# Patient Record
Sex: Female | Born: 1945
Health system: Southern US, Community
[De-identification: ages and names within clinical notes are randomized; demographics above are authoritative.]

## PROBLEM LIST (undated history)

## (undated) DIAGNOSIS — G629 Polyneuropathy, unspecified: Secondary | ICD-10-CM

## (undated) DIAGNOSIS — M1990 Unspecified osteoarthritis, unspecified site: Secondary | ICD-10-CM

## (undated) DIAGNOSIS — E559 Vitamin D deficiency, unspecified: Secondary | ICD-10-CM

## (undated) DIAGNOSIS — F32A Depression, unspecified: Secondary | ICD-10-CM

## (undated) DIAGNOSIS — G5793 Unspecified mononeuropathy of bilateral lower limbs: Secondary | ICD-10-CM

## (undated) DIAGNOSIS — R51 Headache: Secondary | ICD-10-CM

## (undated) DIAGNOSIS — K5792 Diverticulitis of intestine, part unspecified, without perforation or abscess without bleeding: Secondary | ICD-10-CM

## (undated) DIAGNOSIS — I10 Essential (primary) hypertension: Secondary | ICD-10-CM

## (undated) DIAGNOSIS — F329 Major depressive disorder, single episode, unspecified: Secondary | ICD-10-CM

## (undated) DIAGNOSIS — G5601 Carpal tunnel syndrome, right upper limb: Secondary | ICD-10-CM

## (undated) DIAGNOSIS — M5090 Cervical disc disorder, unspecified, unspecified cervical region: Secondary | ICD-10-CM

## (undated) DIAGNOSIS — K635 Polyp of colon: Secondary | ICD-10-CM

## (undated) DIAGNOSIS — N189 Chronic kidney disease, unspecified: Secondary | ICD-10-CM

## (undated) DIAGNOSIS — G43909 Migraine, unspecified, not intractable, without status migrainosus: Secondary | ICD-10-CM

## (undated) DIAGNOSIS — M19041 Primary osteoarthritis, right hand: Secondary | ICD-10-CM

## (undated) DIAGNOSIS — J45909 Unspecified asthma, uncomplicated: Secondary | ICD-10-CM

## (undated) DIAGNOSIS — E785 Hyperlipidemia, unspecified: Secondary | ICD-10-CM

## (undated) DIAGNOSIS — D649 Anemia, unspecified: Secondary | ICD-10-CM

## (undated) DIAGNOSIS — K219 Gastro-esophageal reflux disease without esophagitis: Secondary | ICD-10-CM

## (undated) DIAGNOSIS — R519 Headache, unspecified: Secondary | ICD-10-CM

## (undated) DIAGNOSIS — N184 Chronic kidney disease, stage 4 (severe): Secondary | ICD-10-CM

## (undated) HISTORY — DX: Diverticulitis of intestine, part unspecified, without perforation or abscess without bleeding: K57.92

## (undated) HISTORY — DX: Carpal tunnel syndrome, right upper limb: G56.01

## (undated) HISTORY — PX: CARPAL TUNNEL RELEASE: SHX101

## (undated) HISTORY — PX: WISDOM TOOTH EXTRACTION: SHX21

## (undated) HISTORY — PX: CERVICAL SPINE SURGERY: SHX589

## (undated) HISTORY — DX: Primary osteoarthritis, right hand: M19.041

## (undated) HISTORY — PX: COLONOSCOPY: SHX174

## (undated) HISTORY — DX: Polyp of colon: K63.5

## (undated) HISTORY — PX: EYE SURGERY: SHX253

## (undated) HISTORY — DX: Unspecified osteoarthritis, unspecified site: M19.90

## (undated) HISTORY — PX: CHOLECYSTECTOMY: SHX55

## (undated) HISTORY — DX: Hyperlipidemia, unspecified: E78.5

## (undated) HISTORY — DX: Migraine, unspecified, not intractable, without status migrainosus: G43.909

## (undated) HISTORY — PX: ABDOMINAL HYSTERECTOMY: SHX81

## (undated) HISTORY — DX: Chronic kidney disease, stage 4 (severe): N18.4

## (undated) HISTORY — DX: Vitamin D deficiency, unspecified: E55.9

## (undated) HISTORY — PX: NECK SURGERY: SHX720

## (undated) HISTORY — DX: Unspecified mononeuropathy of bilateral lower limbs: G57.93

---

## 2004-09-13 ENCOUNTER — Ambulatory Visit: Payer: Self-pay | Admitting: Internal Medicine

## 2005-01-30 ENCOUNTER — Ambulatory Visit: Payer: Self-pay | Admitting: Unknown Physician Specialty

## 2005-01-31 ENCOUNTER — Ambulatory Visit: Payer: Self-pay | Admitting: Gastroenterology

## 2005-10-17 ENCOUNTER — Ambulatory Visit: Payer: Self-pay | Admitting: Internal Medicine

## 2006-10-22 ENCOUNTER — Ambulatory Visit: Payer: Self-pay | Admitting: Internal Medicine

## 2007-10-23 ENCOUNTER — Ambulatory Visit: Payer: Self-pay | Admitting: Internal Medicine

## 2008-07-15 ENCOUNTER — Emergency Department: Payer: Self-pay | Admitting: Unknown Physician Specialty

## 2008-07-15 IMAGING — CT CT HEAD WITHOUT CONTRAST
2 series · 16 of 30 positions shown, 20 images · non-contrast
Comparison: none

REASON FOR EXAM: weakness dizziness
COMMENTS:

[Series 2: without · axial · non-contrast · 0.40mm/px · z∈[+264,+384]mm · 13 of 30 slices shown, 17 images]
[im 3/30  brain]
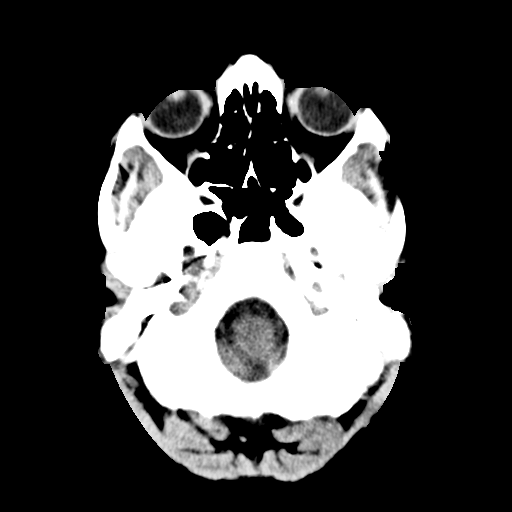
[im 3/30  bone]
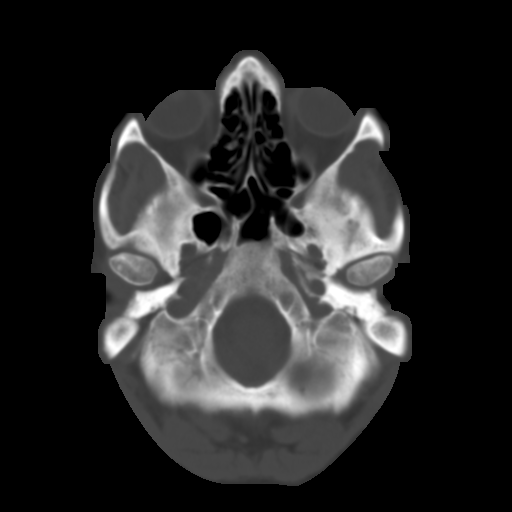
[im 5/30  brain]
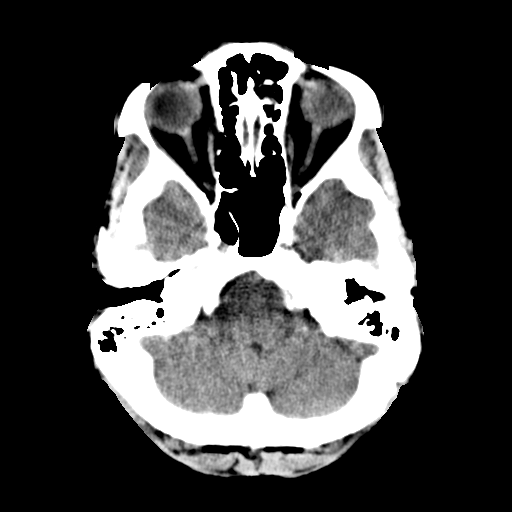
[im 7/30  brain]
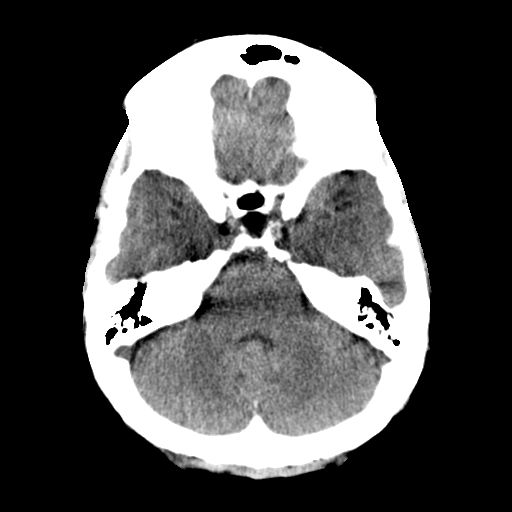
[im 9/30  brain]
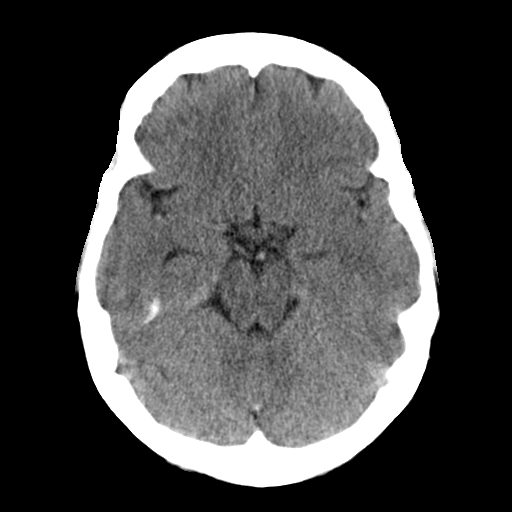
[im 11/30  brain]
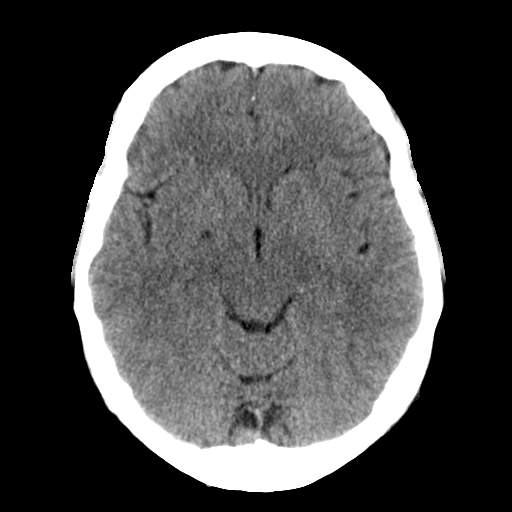
[im 11/30  bone]
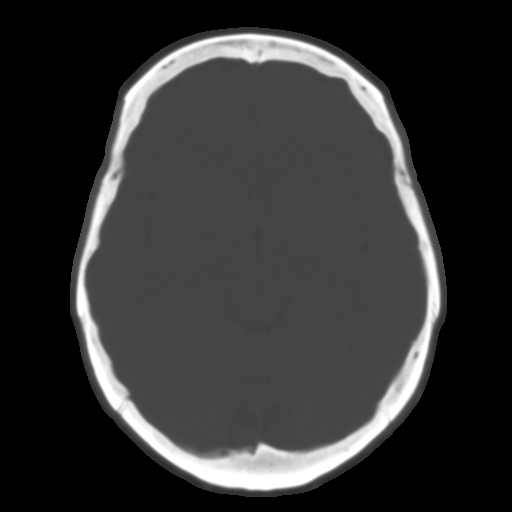
[im 13/30  brain]
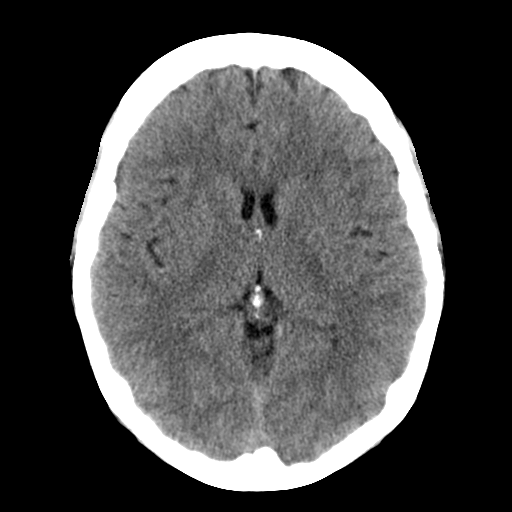
[im 15/30  brain]
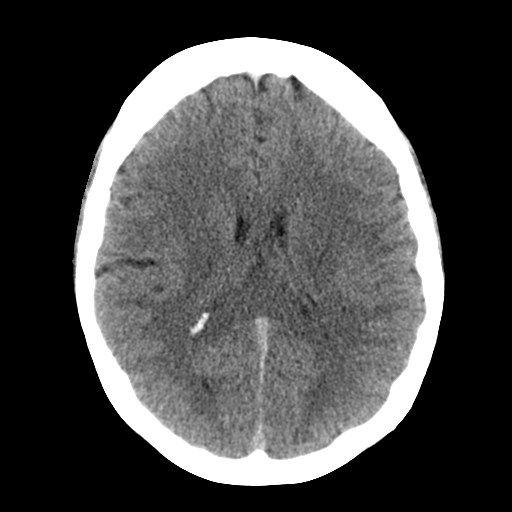
[im 17/30  brain]
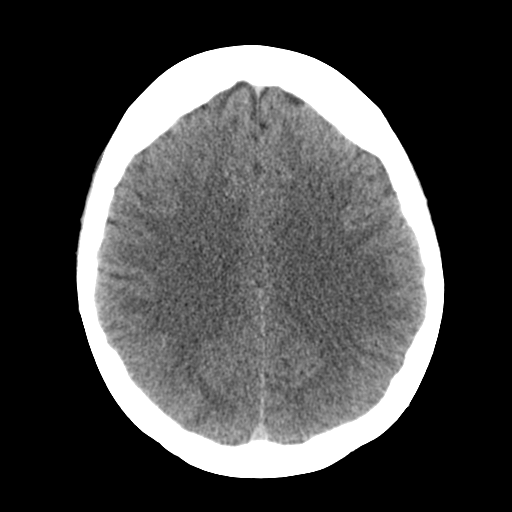
[im 19/30  brain]
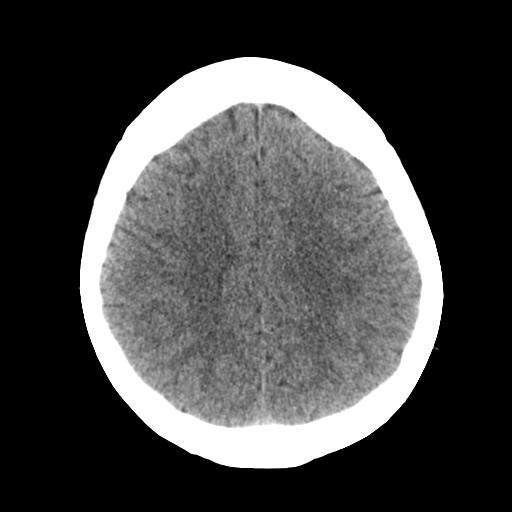
[im 19/30  bone]
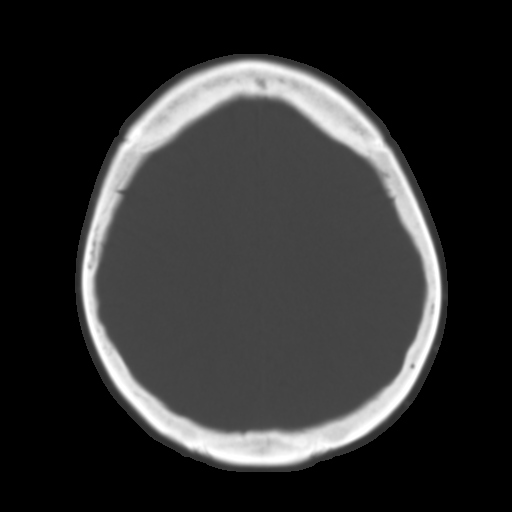
[im 21/30  brain]
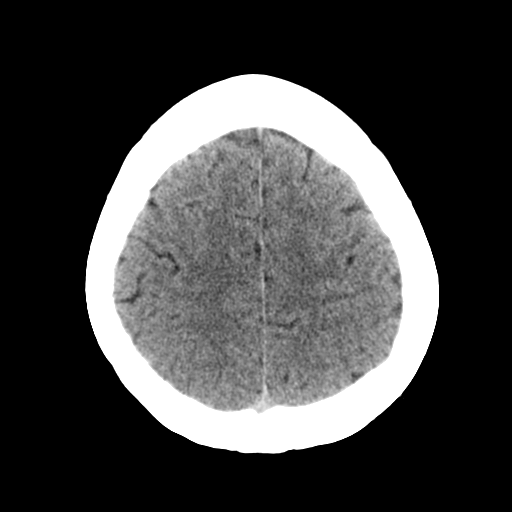
[im 23/30  brain]
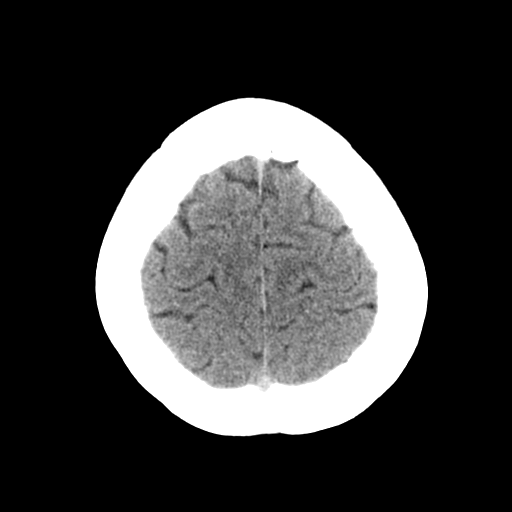
[im 25/30  brain]
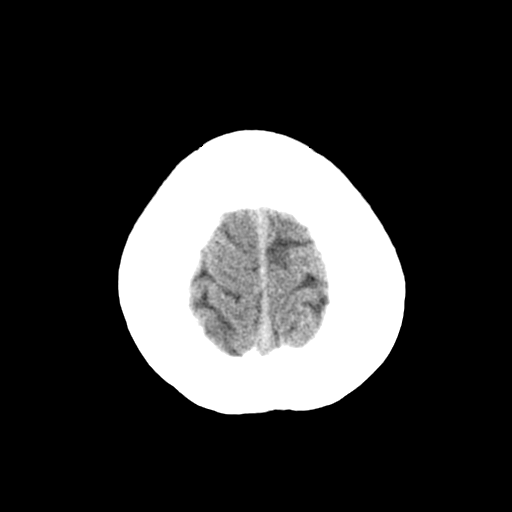
[im 27/30  brain]
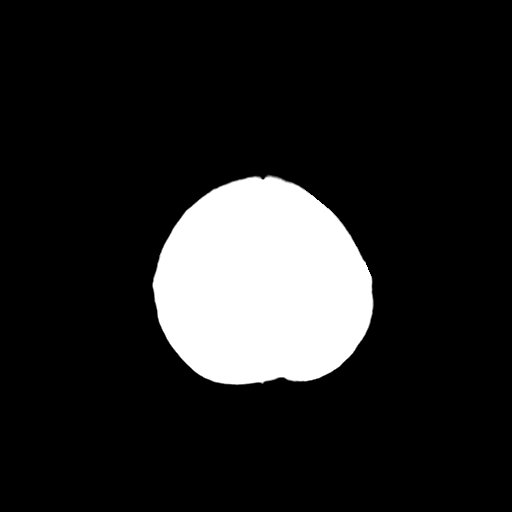
[im 27/30  bone]
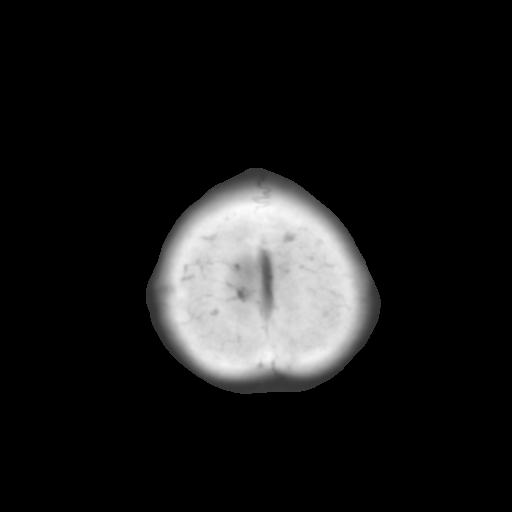

[Series 3: bone · axial · 0.40mm/px · z∈[+264,+304]mm · 3 of 30 slices shown]
[im 3/30  bone]
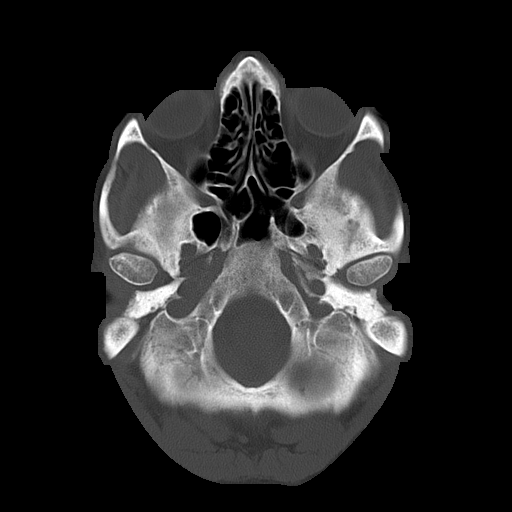
[im 7/30  bone]
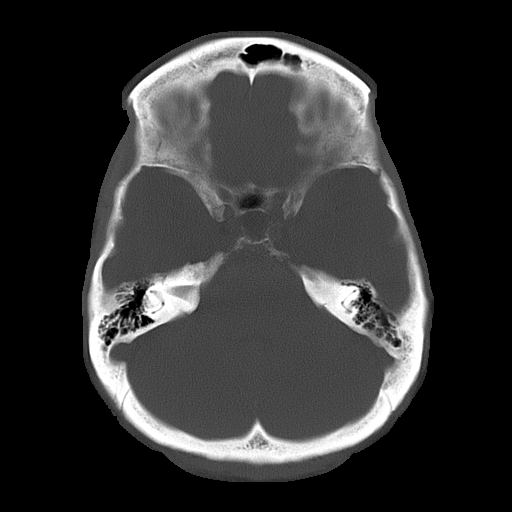
[im 11/30  bone]
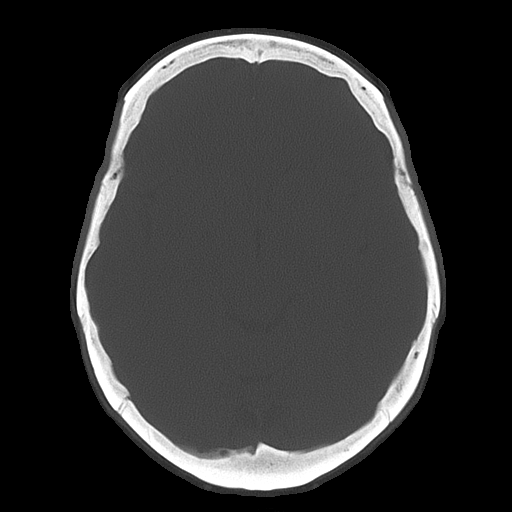

[16 of 30 positions shown; findings below may reference images not displayed]

PROCEDURE:     CT  - CT HEAD WITHOUT CONTRAST  - [DATE]  [DATE]

RESULT:     A focal area of well defined low attenuation projects along the
medial base of the RIGHT temporal lobe. Differential considerations are a
region of chronic infarction with subsequent gliosis versus a perivascular
space. No further intra-axial nor extra-axial fluid collections are
identified. There is no evidence of acute hemorrhage. No secondary signs are
appreciated to suggest mass effect. The visualized cranium demonstrates no
evidence of a depressed skull fracture.
IMPRESSION: 1. Chronic changes without evidence of acute abnormalities.

Dr. SUZETTE of the Emergency Department was informed of these findings at the
time of the initial interpretation.

## 2008-07-15 IMAGING — CR DG CHEST 2V
1 series · 2 of 2 positions shown · non-contrast
Comparison: none

REASON FOR EXAM: weakness
COMMENTS:

PROCEDURE:     DXR - DXR CHEST PA (OR AP) AND LATERAL  - [DATE]  [DATE]
RESULT:     The lungs are clear. The cardiac silhouette and visualized bony
skeleton are unremarkable.

[Series 1: view not recorded · 0.17mm/px · 2 of 2 slices shown]
[im 1/2]
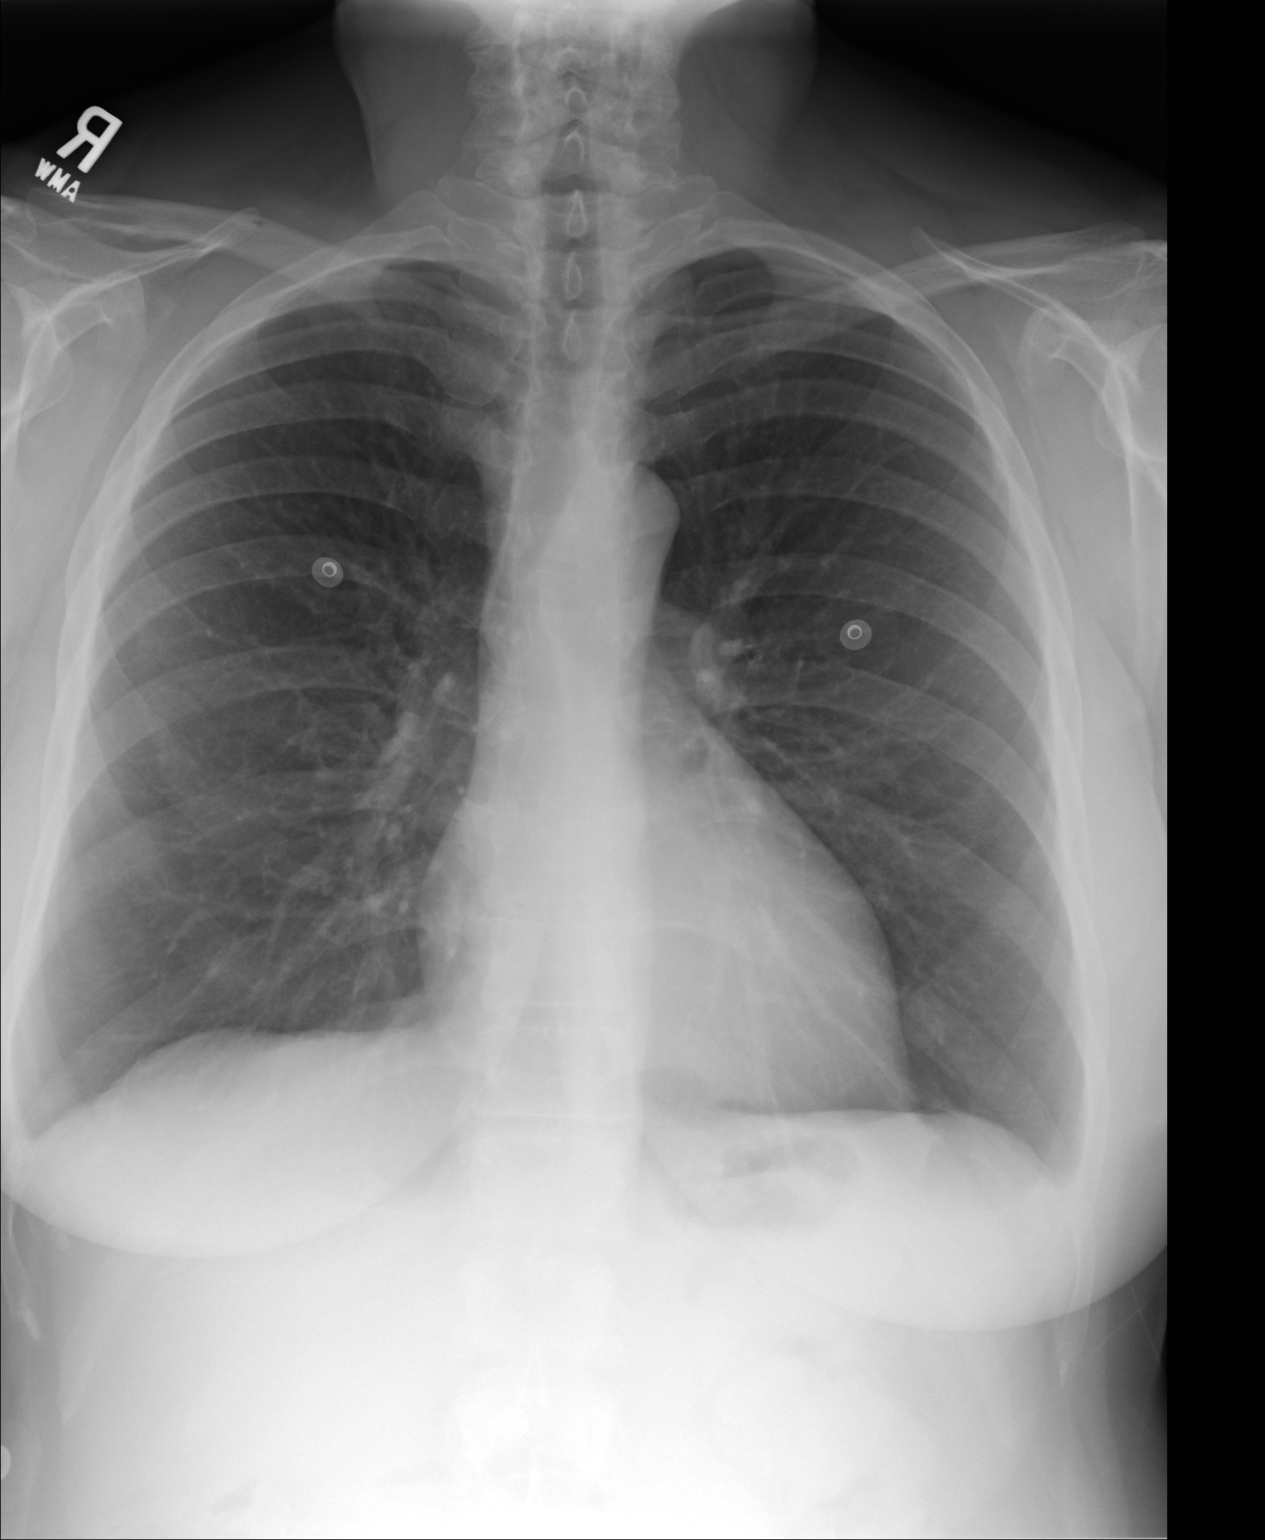
[im 2/2]
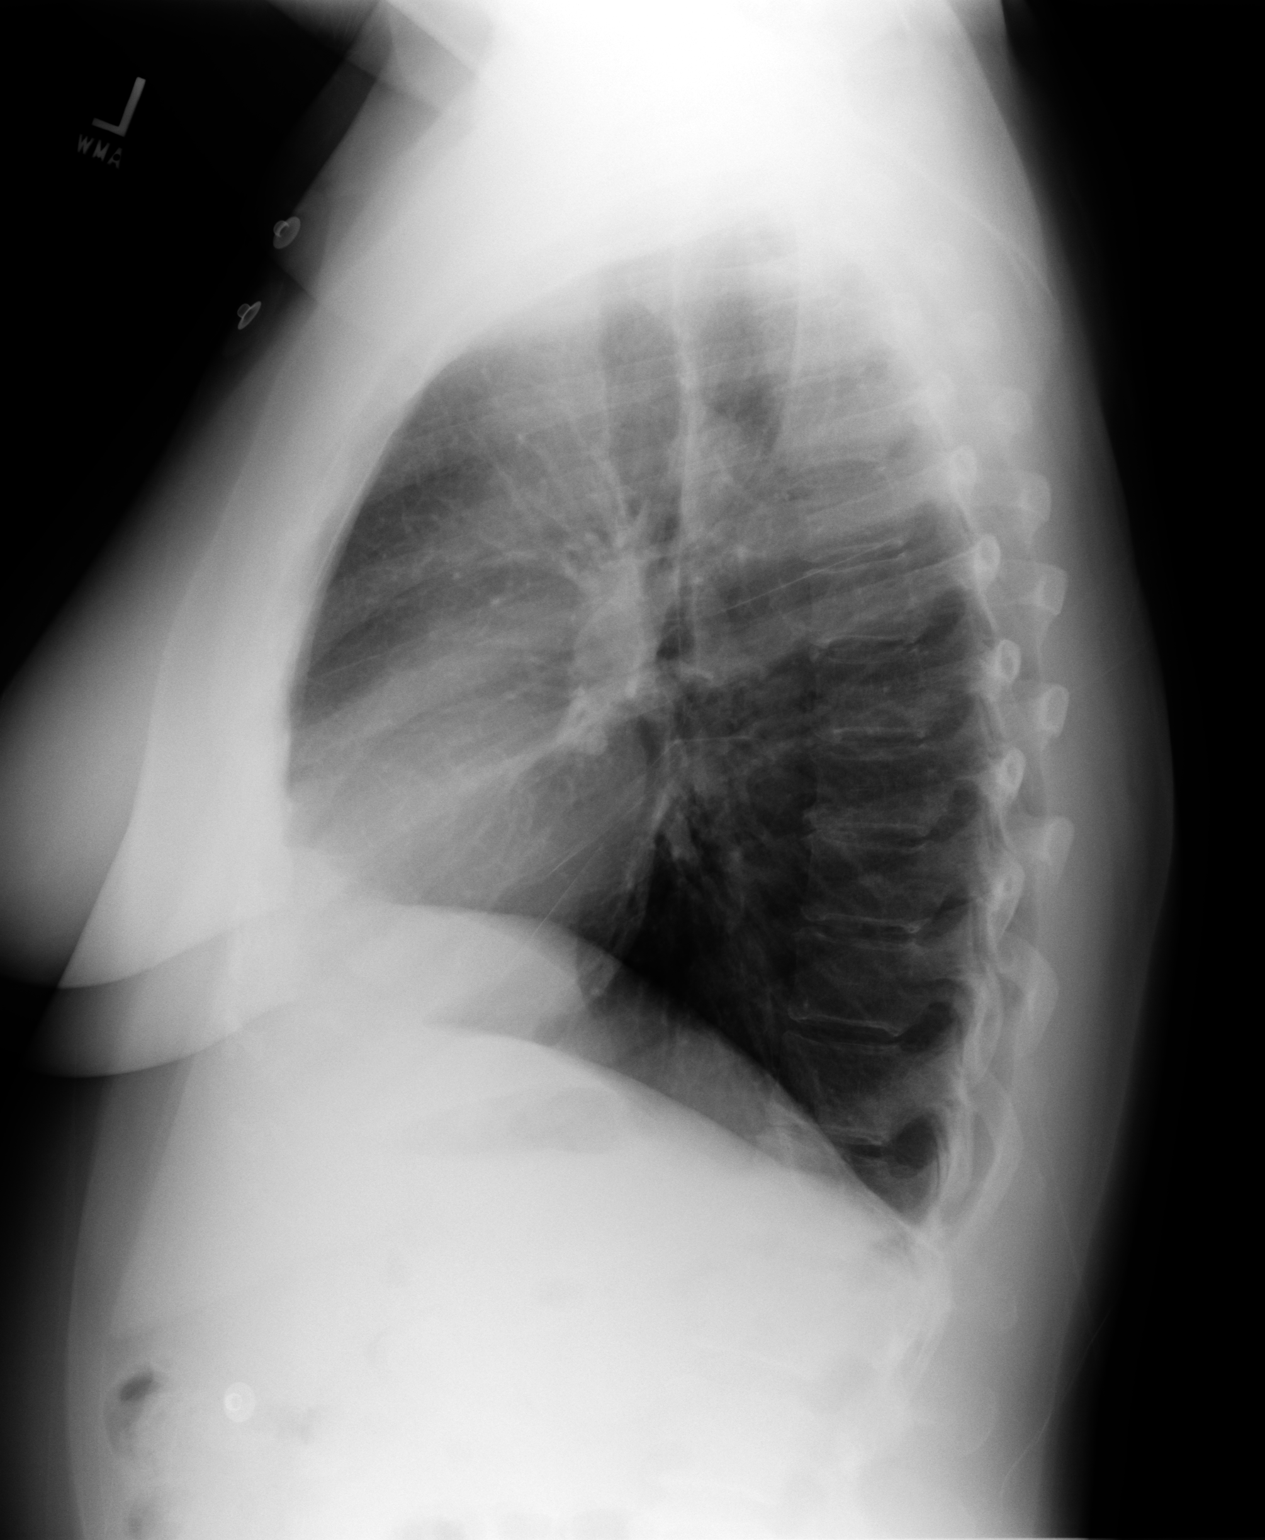

[2 of 2 positions shown; findings below may reference images not displayed]

IMPRESSION: 1. Chest radiograph without evidence of acute cardiopulmonary disease.

## 2008-08-04 ENCOUNTER — Ambulatory Visit: Payer: Self-pay | Admitting: Internal Medicine

## 2008-08-04 IMAGING — CT CT ANGIOGRAPHY NECK
1 of 4 series · 12 of 33 positions shown · IV contrast (APPLIED)
Comparison: none

REASON FOR EXAM: CVA per ER    dissected left ICA  CALL report  [[email or web address]]
Dr ANDARA
COMMENTS:

[Series 4: soft tissue · axial · 0.35mm/px · z∈[-819,-615]mm · 12 of 82 slices shown]
[im 7/82  soft-tissue]
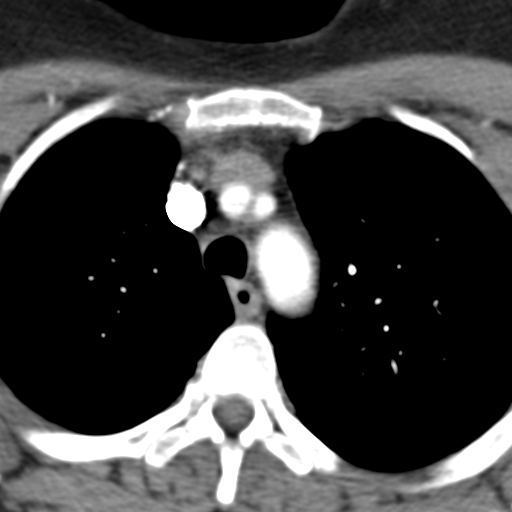
[im 13/82  bone]
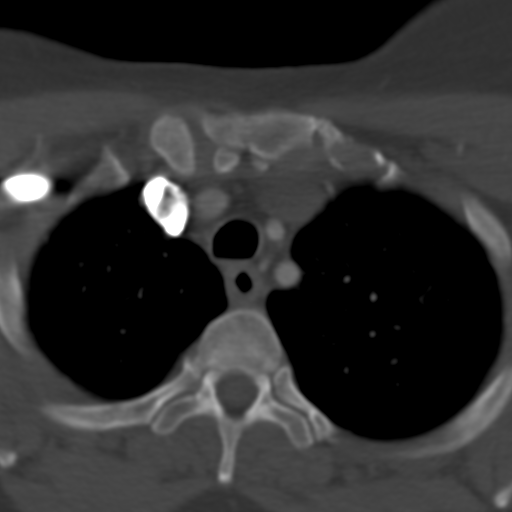
[im 19/82  soft-tissue]
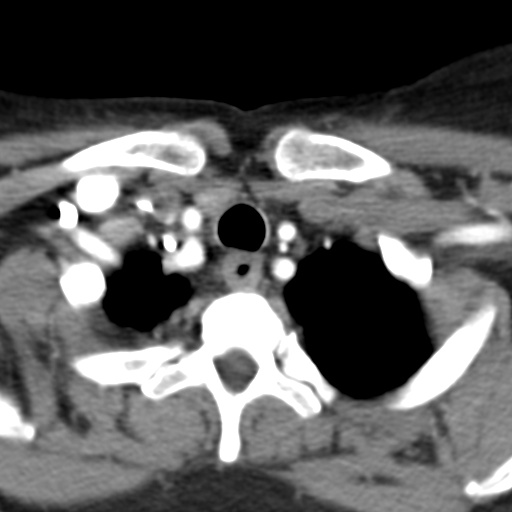
[im 25/82  bone]
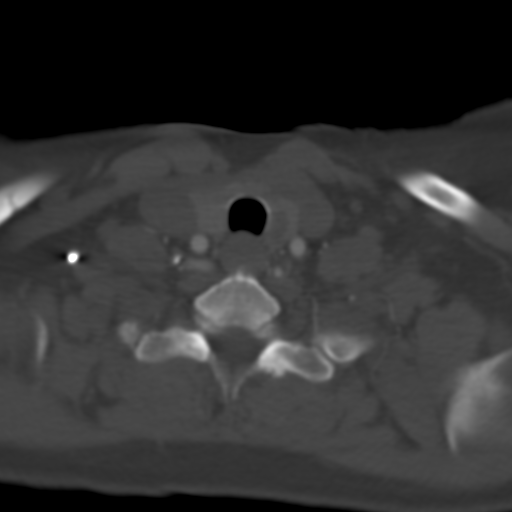
[im 32/82  soft-tissue]
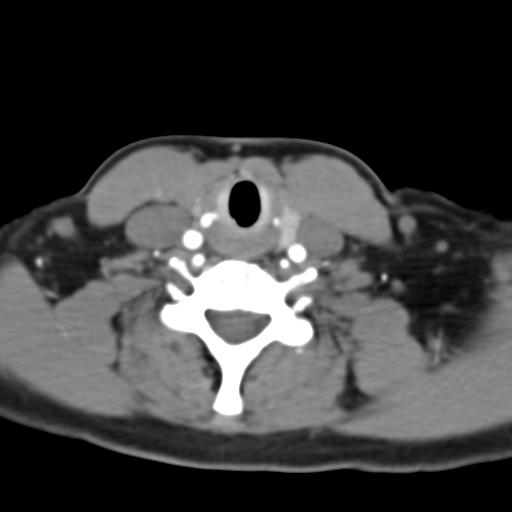
[im 38/82  bone]
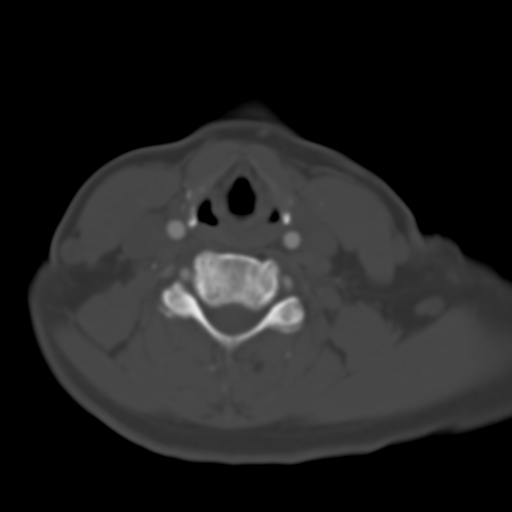
[im 44/82  soft-tissue]
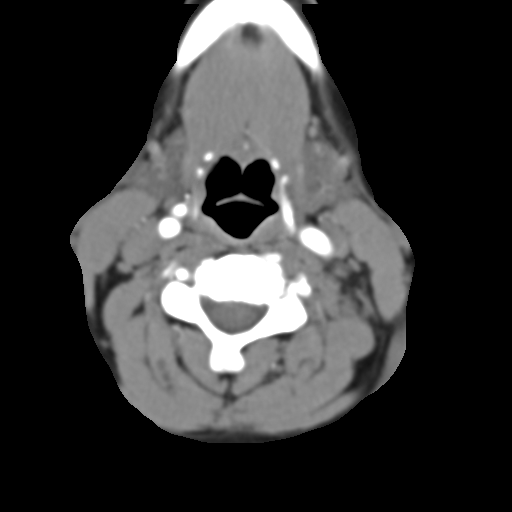
[im 50/82  bone]
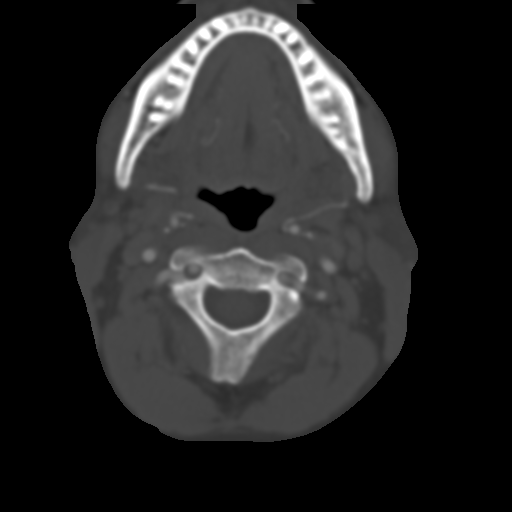
[im 57/82  soft-tissue]
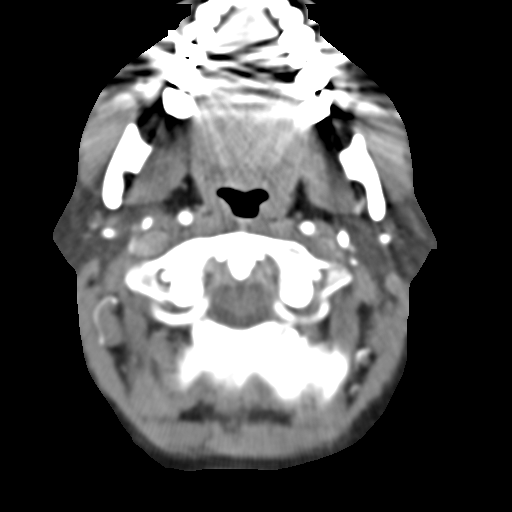
[im 63/82  bone]
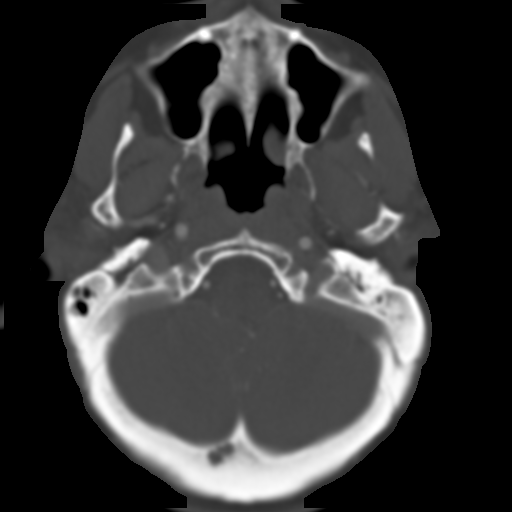
[im 69/82  soft-tissue]
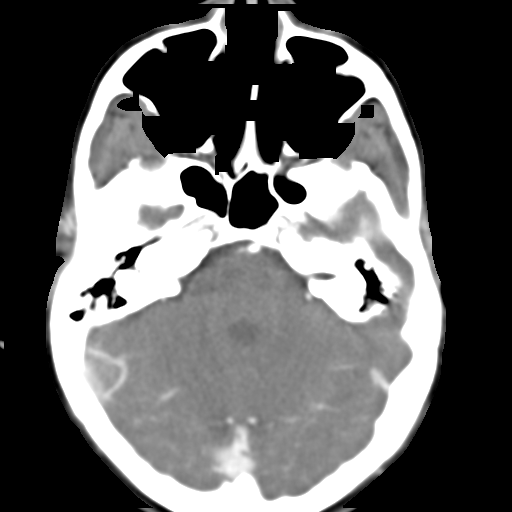
[im 75/82  bone]
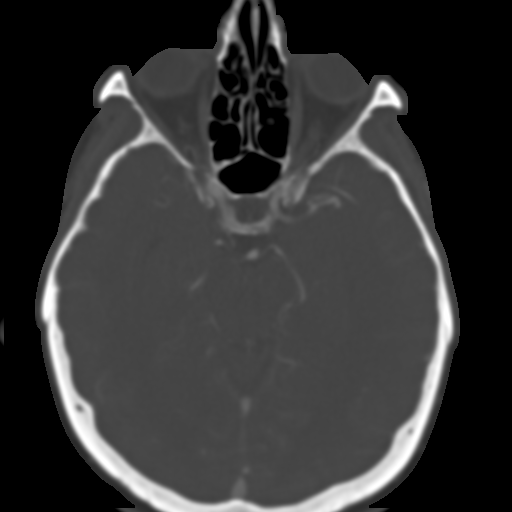

[12 of 33 positions shown; findings below may reference images not displayed]

PROCEDURE:     CT  - CT ANGIOGRAPHY NECK W/WO  - [DATE]  [DATE]

RESULT:     Axial source images 3-D reconstructions and 2-D maximum
intensity projections were performed of the RIGHT and LEFT carotid systems.

Evaluation of the RIGHT and LEFT carotid systems demonstrates no evidence of
focal outpouchings, fusiform dilatation, strictural narrowing, nor intimal
flap consistent with dissection. There is no evidence of mural
calcifications nor plaques.
IMPRESSION: 1. Unremarkable bilateral carotid angiogram as described above.

## 2008-10-26 ENCOUNTER — Ambulatory Visit: Payer: Self-pay | Admitting: Internal Medicine

## 2009-11-15 ENCOUNTER — Ambulatory Visit: Payer: Self-pay | Admitting: Internal Medicine

## 2011-01-21 ENCOUNTER — Ambulatory Visit: Payer: Self-pay | Admitting: Gastroenterology

## 2011-05-08 ENCOUNTER — Ambulatory Visit: Payer: Self-pay | Admitting: Internal Medicine

## 2012-05-12 ENCOUNTER — Ambulatory Visit: Payer: Self-pay | Admitting: Internal Medicine

## 2012-05-26 ENCOUNTER — Ambulatory Visit: Payer: Self-pay | Admitting: Internal Medicine

## 2012-05-26 IMAGING — US ABDOMEN ULTRASOUND
1 series · 14 of 25 positions shown · non-contrast
Comparison: none

REASON FOR EXAM: epigastric pain
COMMENTS:

PROCEDURE:     US  - US ABDOMEN GENERAL SURVEY  - [DATE]  [DATE]
RESULT:     History: Epigastric pain.
Comparison Study: No recent.

[Series 1: abdomen ultrasound · 0.28mm/px · 14 of 115 slices shown]
[im 1/115]
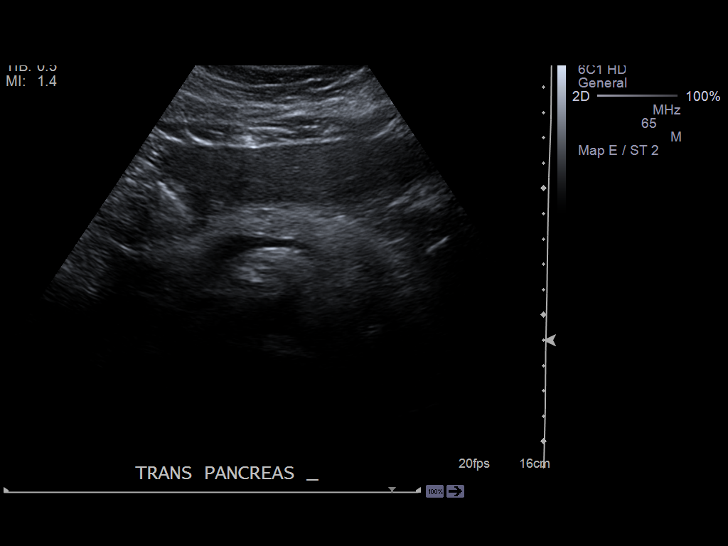
[im 10/115]
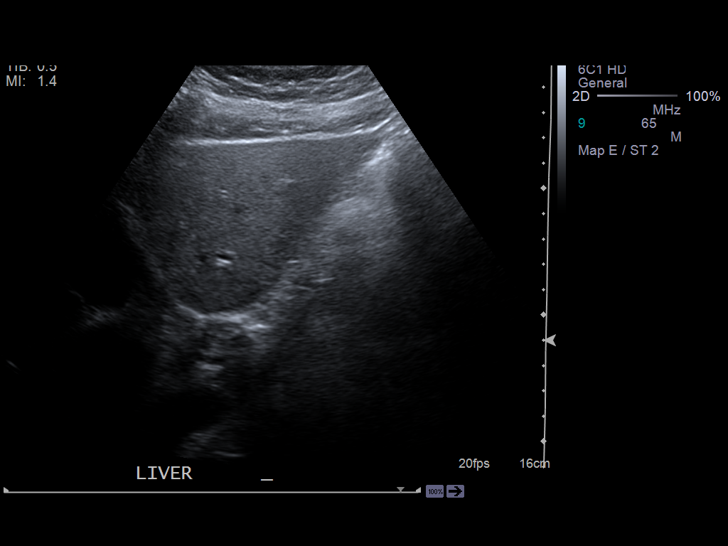
[im 20/115]
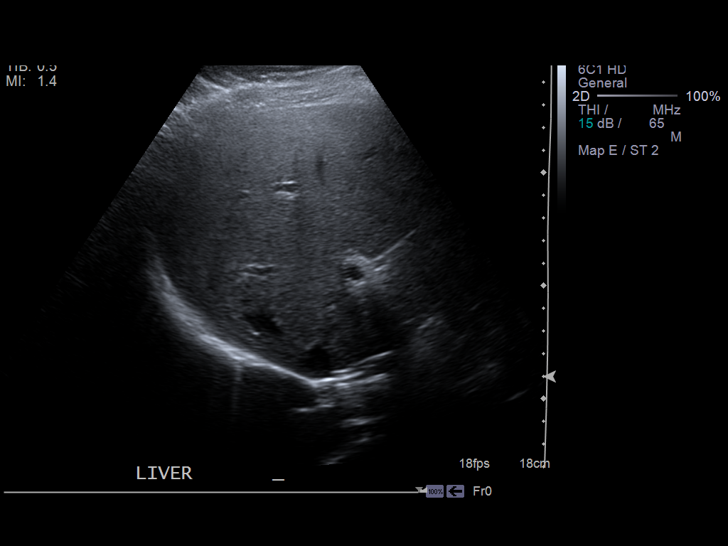
[im 29/115]
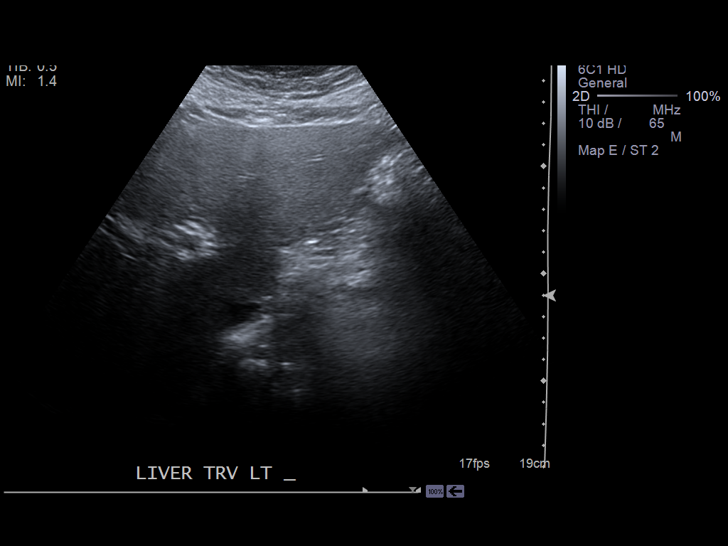
[im 39/115]
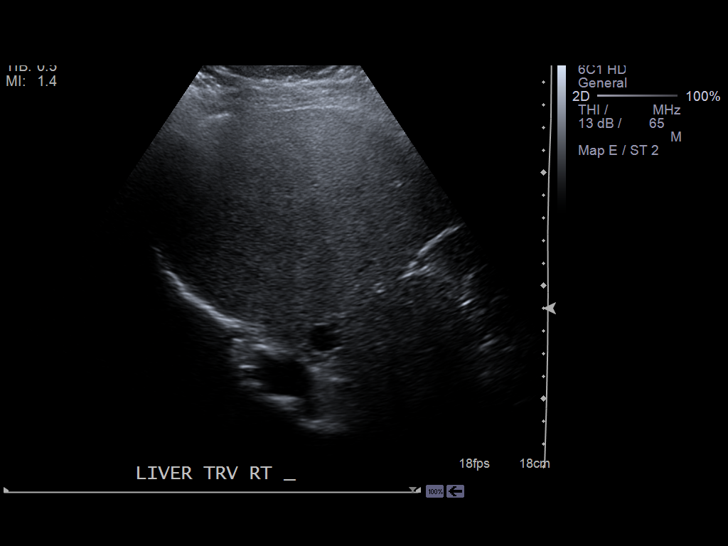
[im 43/115]
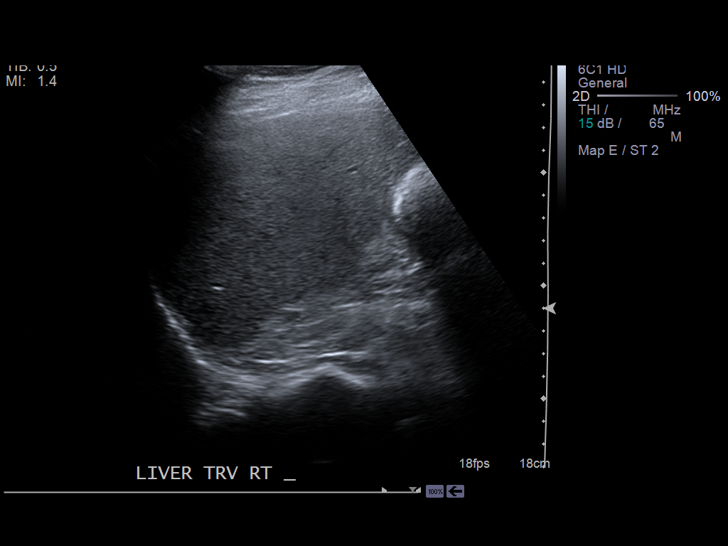
[im 53/115]
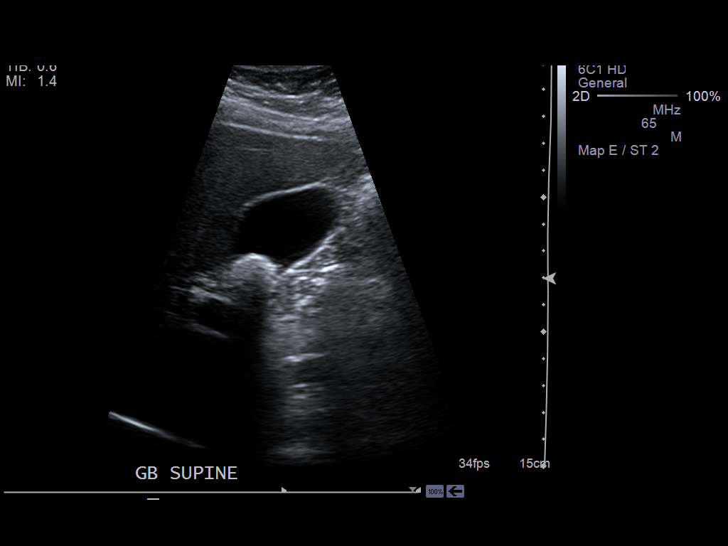
[im 62/115]
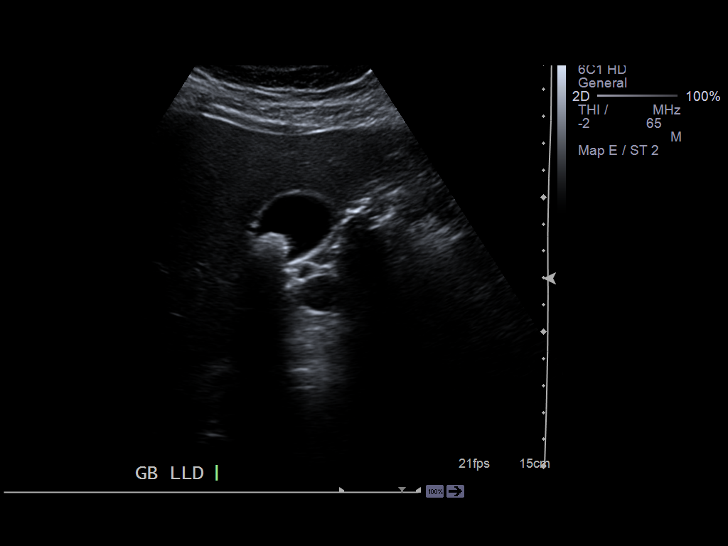
[im 72/115]
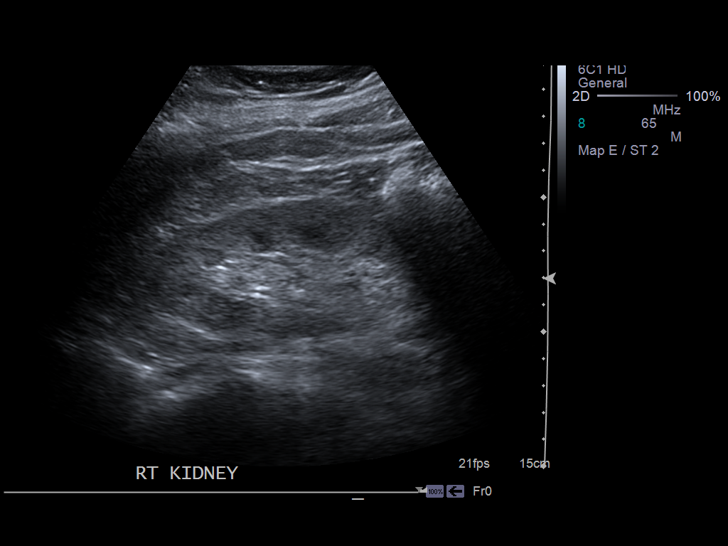
[im 77/115]
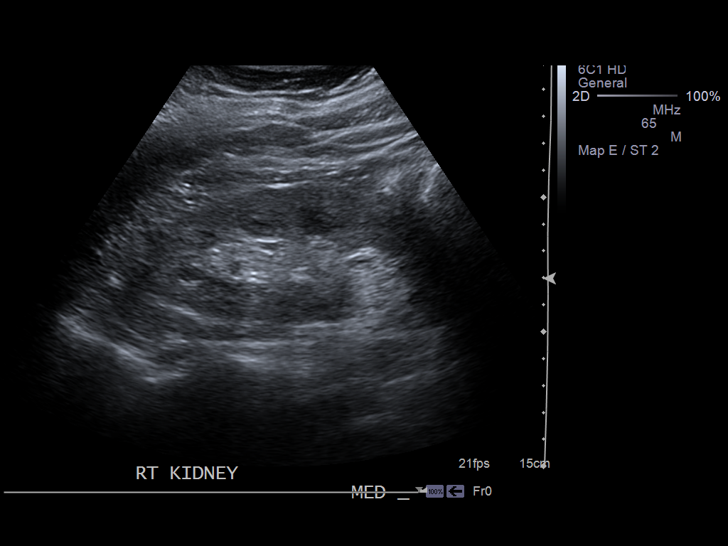
[im 86/115]
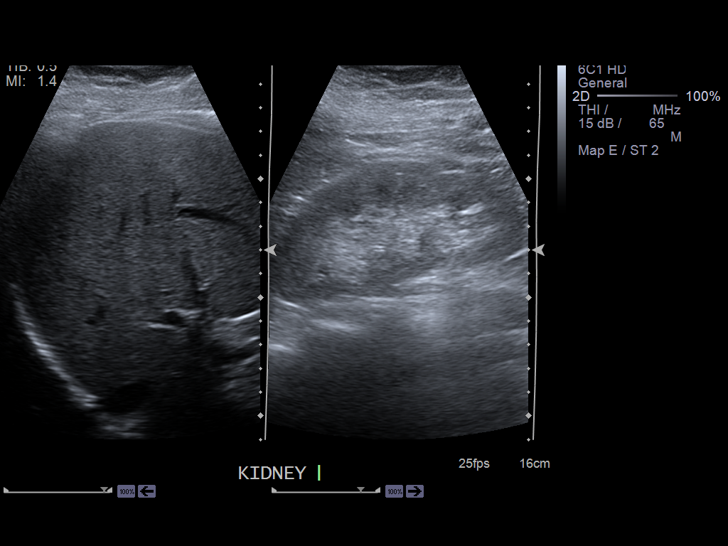
[im 96/115]
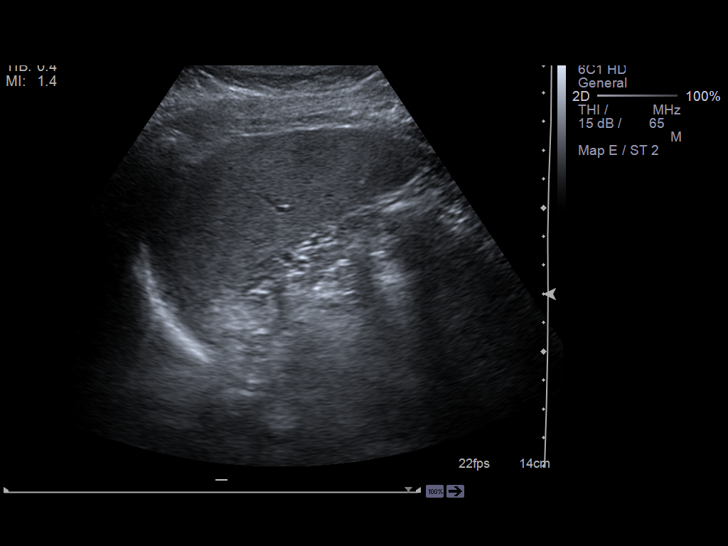
[im 105/115]
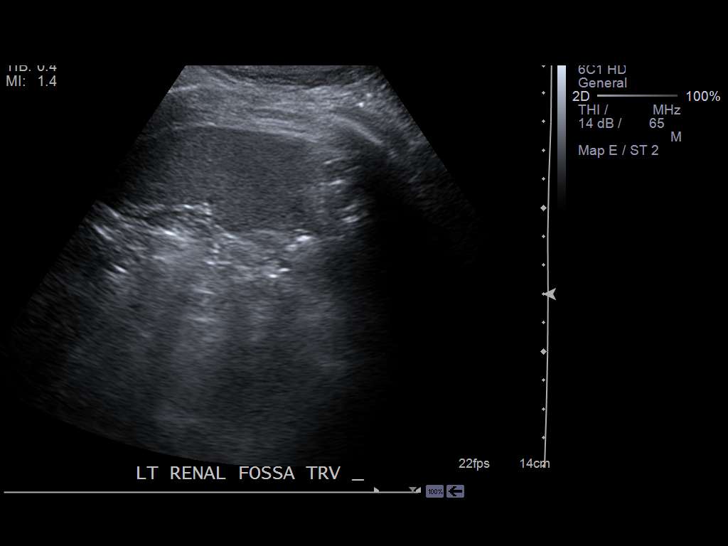
[im 115/115]
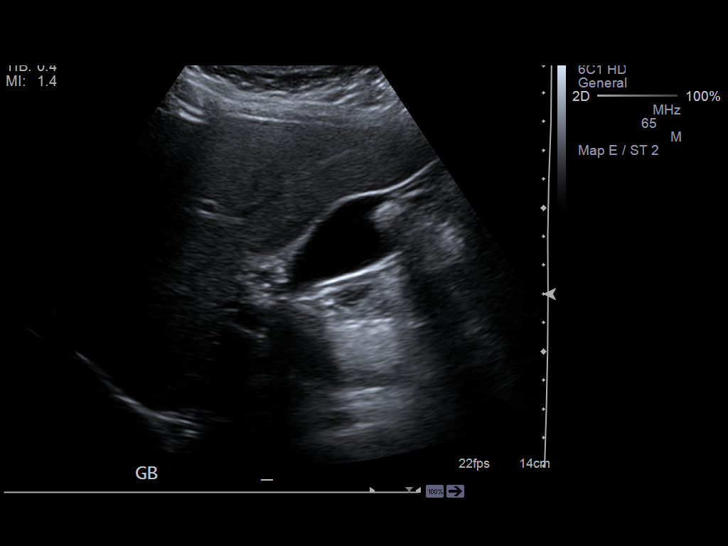

[14 of 25 positions shown; findings below may reference images not displayed]

FINDINGS: Gallstones are present. Negative Murphy's sign. Gallbladder wall
[TE]. 8mm,common bile duct diameter 5 mm. Spleen normal. Normal right
kidney. Left kidney not visualized. 2 cm septated cystright lobe of the
liver. CT of the abdomen can be obtained for further evaluation.
IMPRESSION: 2 cm complex cyst right lobe of liver  for which CT can be
obtained for further evaluation. Gallstones. Left kidney not visualized. By
history patient states that she has no left kidney.

## 2012-06-09 ENCOUNTER — Ambulatory Visit: Payer: Self-pay | Admitting: Anesthesiology

## 2012-06-09 LAB — CBC WITH DIFFERENTIAL/PLATELET
Basophil #: 0 10*3/uL (ref 0.0–0.1)
Basophil %: 0.9 %
HCT: 39.2 % (ref 35.0–47.0)
HGB: 13.4 g/dL (ref 12.0–16.0)
Lymphocyte #: 1.4 10*3/uL (ref 1.0–3.6)
MCV: 83 fL (ref 80–100)
Neutrophil %: 57.7 %
RBC: 4.72 10*6/uL (ref 3.80–5.20)
RDW: 13.8 % (ref 11.5–14.5)

## 2012-06-09 LAB — BASIC METABOLIC PANEL
BUN: 34 mg/dL — ABNORMAL HIGH (ref 7–18)
Calcium, Total: 9 mg/dL (ref 8.5–10.1)
Chloride: 111 mmol/L — ABNORMAL HIGH (ref 98–107)
Co2: 22 mmol/L (ref 21–32)
Creatinine: 1.76 mg/dL — ABNORMAL HIGH (ref 0.60–1.30)
EGFR (Non-African Amer.): 30 — ABNORMAL LOW
Osmolality: 289 (ref 275–301)
Sodium: 141 mmol/L (ref 136–145)

## 2012-06-09 LAB — HEPATIC FUNCTION PANEL A (ARMC)
Albumin: 3.6 g/dL (ref 3.4–5.0)
Bilirubin, Direct: <0.1 mg/dL (ref 0.00–0.20)
Bilirubin,Total: 0.3 mg/dL (ref 0.2–1.0)
SGOT(AST): 22 U/L (ref 15–37)
SGPT (ALT): 25 U/L (ref 12–78)
Total Protein: 7.3 g/dL (ref 6.4–8.2)

## 2012-06-15 ENCOUNTER — Ambulatory Visit: Payer: Self-pay | Admitting: Surgery

## 2012-06-16 LAB — PATHOLOGY REPORT

## 2012-11-15 ENCOUNTER — Emergency Department: Payer: Self-pay | Admitting: Emergency Medicine

## 2012-11-15 IMAGING — CR DG FOOT COMPLETE 3+V*L*
1 series · 3 of 3 positions shown · non-contrast
Comparison: none

REASON FOR EXAM: pain, injury
COMMENTS:

PROCEDURE:     DXR - DXR FOOT LT COMP W/OBLIQUES  - [DATE]  [DATE]
RESULT:     Left foot images demonstrate a transverse fracture at the base
of the fifth metatarsal without distraction or comminution. Bony structures
are otherwise intact.

[Series 1: ap · 0.17mm/px · 3 of 3 slices shown]
[im 1/3]
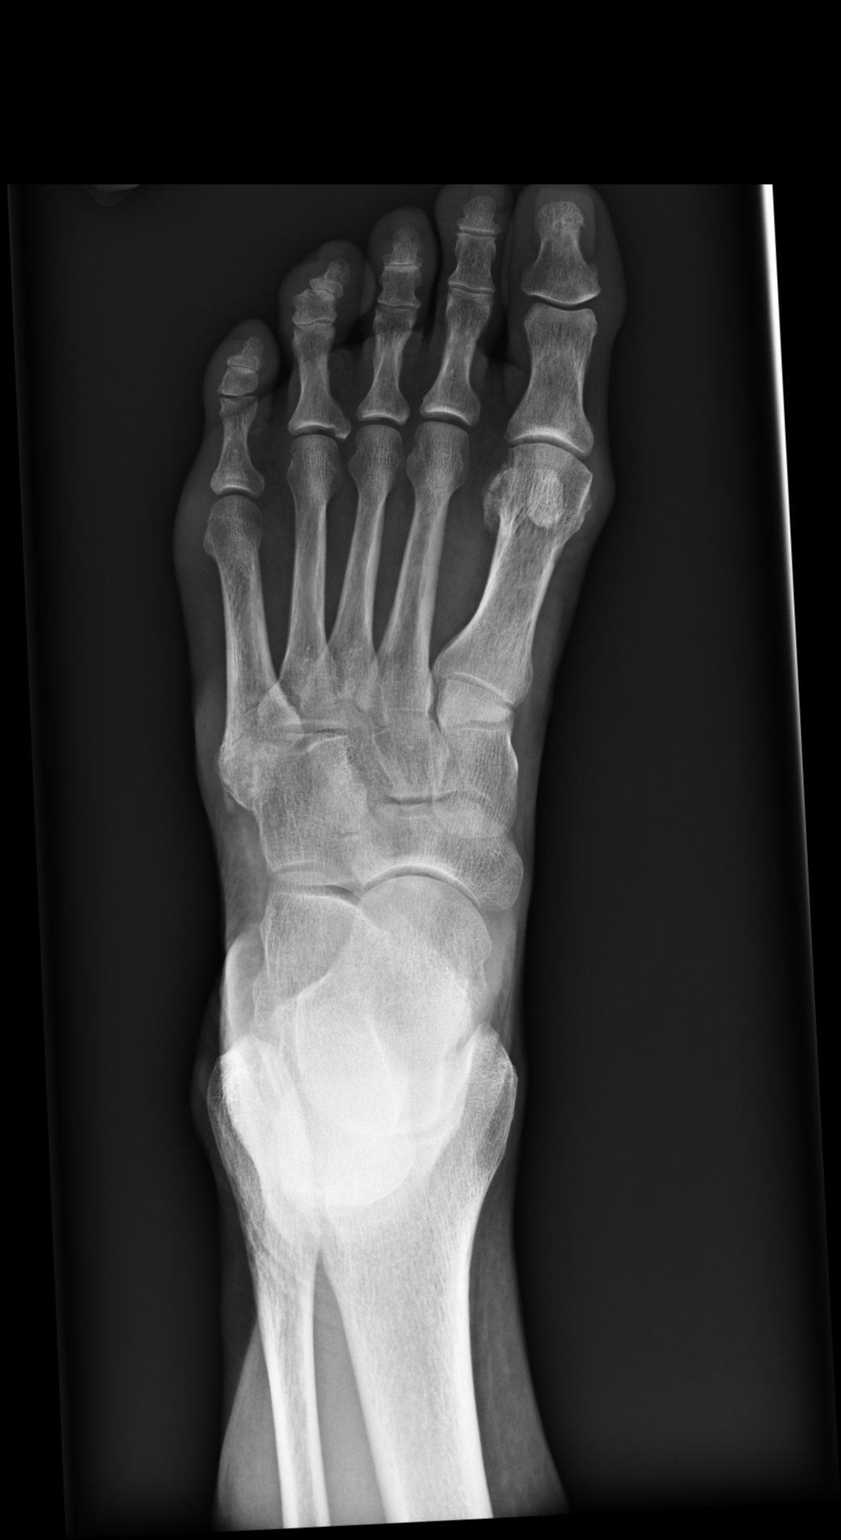
[im 2/3]
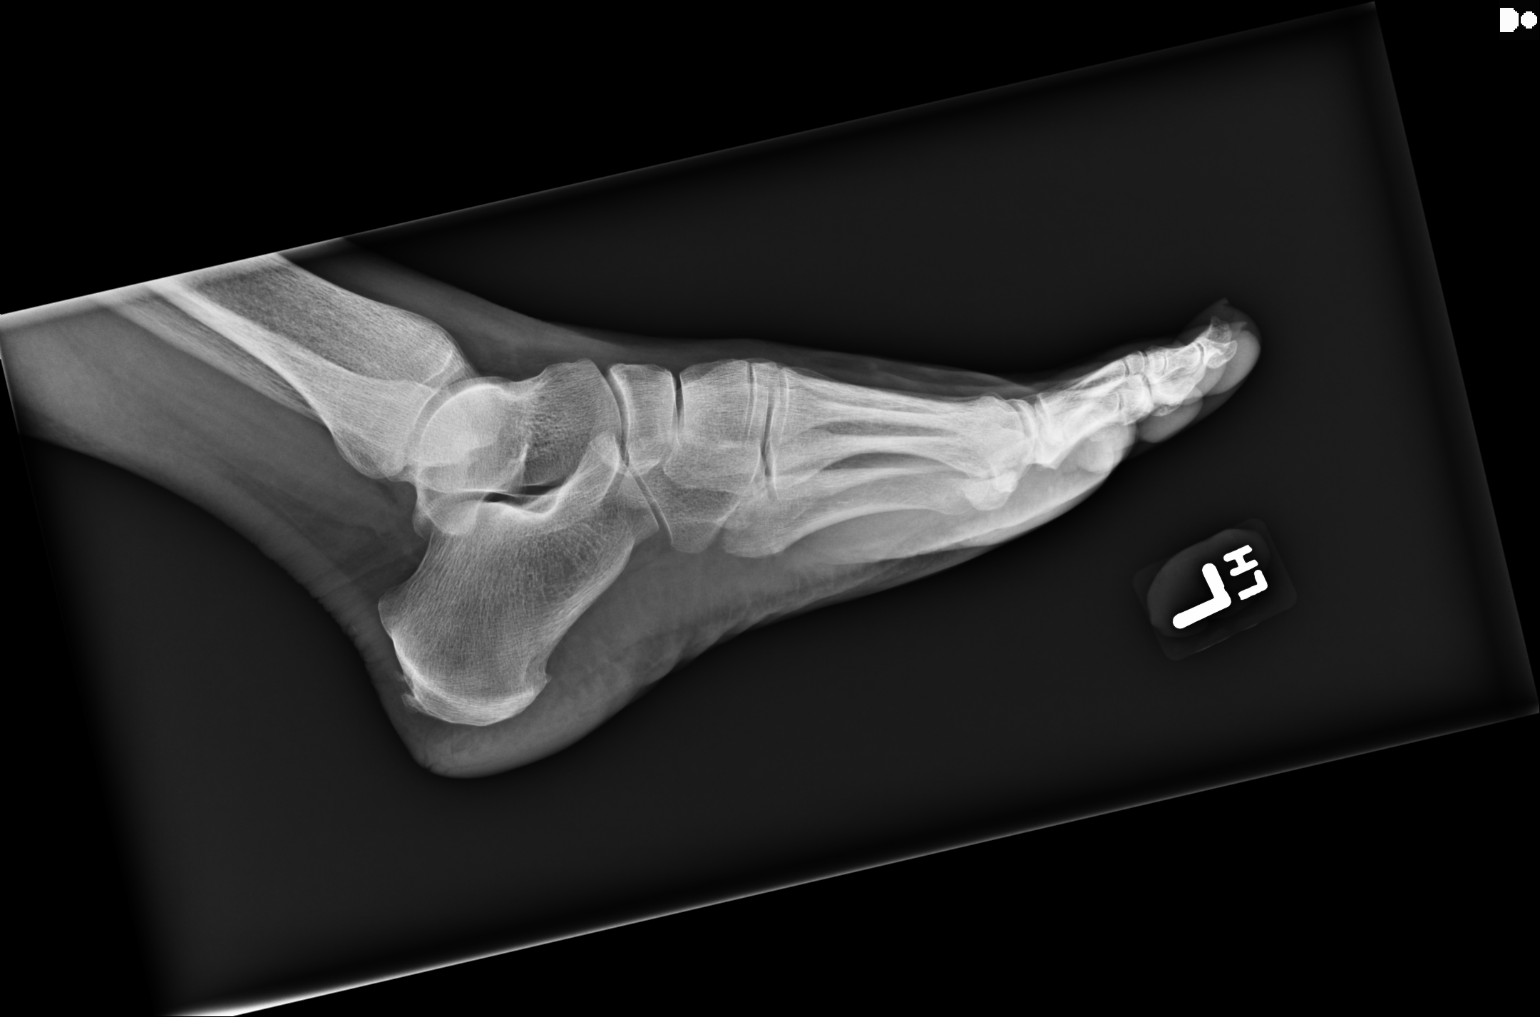
[im 3/3]
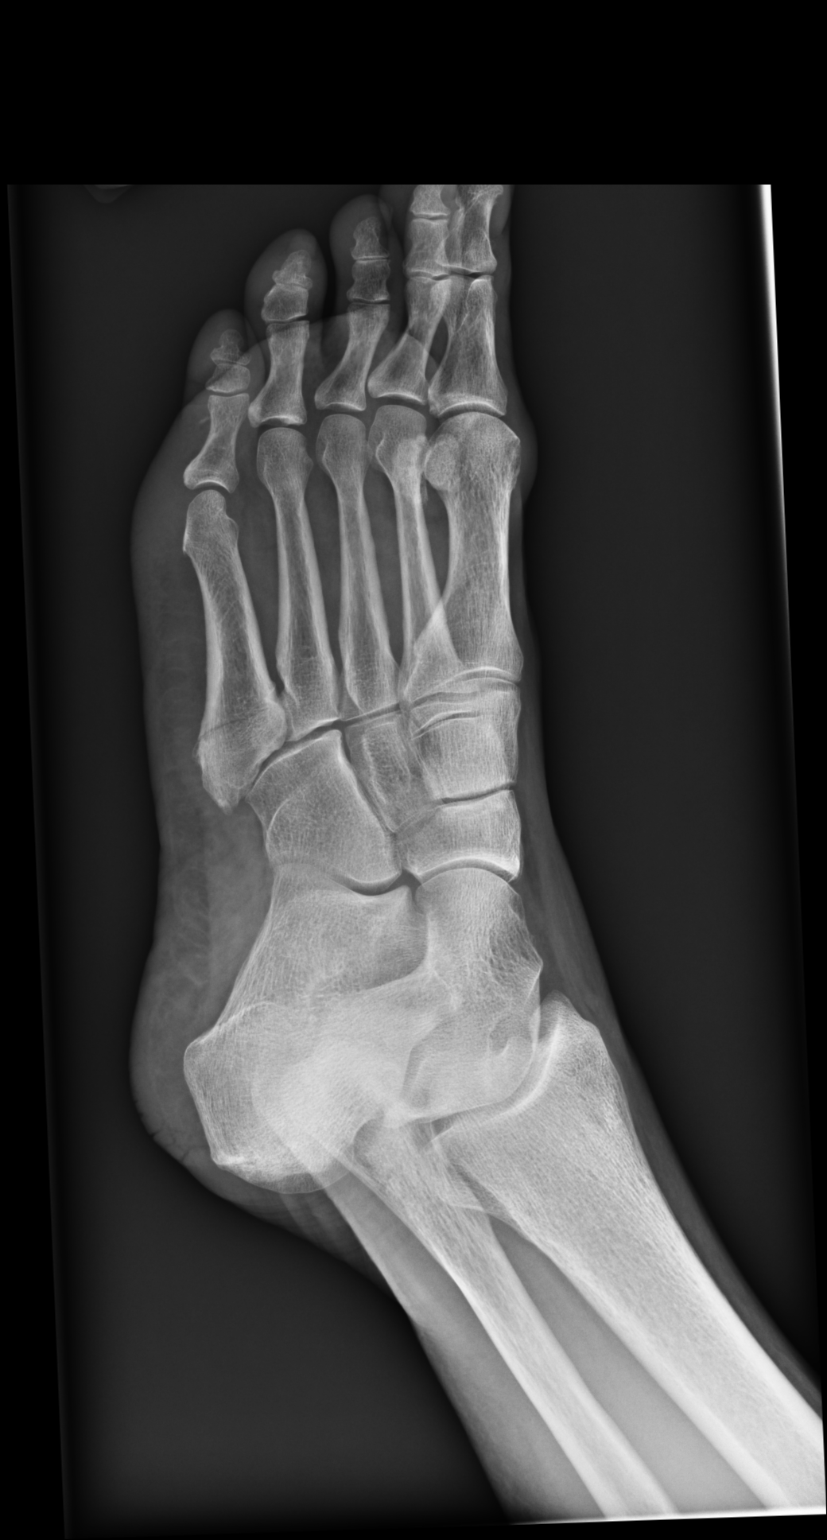

[3 of 3 positions shown; findings below may reference images not displayed]

IMPRESSION: A transverse fracture without comminution or distraction at
the base of the left fifth metatarsal.

[REDACTED]

## 2013-06-09 ENCOUNTER — Ambulatory Visit: Payer: Self-pay | Admitting: Internal Medicine

## 2013-10-30 DIAGNOSIS — K219 Gastro-esophageal reflux disease without esophagitis: Secondary | ICD-10-CM | POA: Insufficient documentation

## 2013-10-30 DIAGNOSIS — N184 Chronic kidney disease, stage 4 (severe): Secondary | ICD-10-CM | POA: Insufficient documentation

## 2013-10-30 DIAGNOSIS — I10 Essential (primary) hypertension: Secondary | ICD-10-CM | POA: Insufficient documentation

## 2013-10-30 DIAGNOSIS — E785 Hyperlipidemia, unspecified: Secondary | ICD-10-CM | POA: Insufficient documentation

## 2014-05-12 DIAGNOSIS — F325 Major depressive disorder, single episode, in full remission: Secondary | ICD-10-CM | POA: Insufficient documentation

## 2014-08-26 NOTE — Op Note (Signed)
PATIENT NAME:  Amy Vazquez, Amy Vazquez MR#:  [id number] DATE OF BIRTH:  May 26, 1945  DATE OF PROCEDURE:  06/15/2012  PREOPERATIVE DIAGNOSIS: Chronic cholecystitis and cholelithiasis.   POSTOPERATIVE DIAGNOSIS: Chronic cholecystitis and cholelithiasis   OPERATION: Robotic-assisted cholecystectomy.   SURGEON: Rodena Goldmann, III, MD    ASSISTANT: Glena Norfolk. Lundquist, MD   ANESTHESIA: General.   OPERATIVE PROCEDURE: With the patient in the supine position after induction of appropriate general anesthesia, the patient's abdomen was prepped with ChloraPrep and draped with sterile towels. An infraumbilical incision was made in the standard fashion, carried down bluntly through the subcutaneous tissue. The Veress needle was used to cannulate the peritoneal cavity. CO2 was insufflated to appropriate pressure measurements. When approximately 2.5 liters of CO2 were instilled, the Veress needle was withdrawn, and a 12 mm Xcel port inserted into the peritoneal cavity. Intraperitoneal position was confirmed. CO2 was reinsufflated. The robotic camera was used to identify the entrance site to the remainder of the ports. The 8 mm ports were inserted, 1 in the left upper quadrant and 2 on the right side. The robot was then docked to the camera and the instrument arms. Instruments were placed under direct vision and directed up to the area of the gallbladder. The instruments were all locked in place, and then the surgeon moved to the robotic console. The gallbladder was grasped, retracted superiorly and laterally exposing the hepatoduodenal ligament. The cystic artery and cystic duct were identified. The cystic duct was doubly clipped on the common duct side, singly clipped on the gallbladder and divided. The artery was doubly clipped and divided. The gallbladder was then dissected free from its bed and delivered using hook and cautery apparatus. Once the gallbladder was free, it was captured in an Endo Catch apparatus and the  robot undocked. The gallbladder was then removed through the 12 mm port in the umbilicus. The area was copiously suctioned and irrigated. All ports were withdrawn without difficulty. The abdomen was desufflated. The midline fascia was closed with figure-of-eight suture of 0 Vicryl. The skin was closed with 5-0 nylon. The area was infiltrated with 0.25% Marcaine for postoperative pain control. Sterile dressings were applied. The patient was returned to the recovery room having tolerated the procedure well.   ____________________________ Micheline Maze, MD rle:cb D: 06/15/2012 09:29:39 ET T: 06/15/2012 10:34:04 ET JOB#: [id number]  cc: Micheline Maze, MD, <Dictator> Ocie Cornfield. Ouida Sills, McEwen MD ELECTRONICALLY SIGNED 06/17/2012 18:04

## 2014-11-11 DIAGNOSIS — Z0000 Encounter for general adult medical examination without abnormal findings: Secondary | ICD-10-CM | POA: Insufficient documentation

## 2014-11-11 DIAGNOSIS — N183 Chronic kidney disease, stage 3 (moderate): Secondary | ICD-10-CM | POA: Diagnosis not present

## 2014-11-11 DIAGNOSIS — J4520 Mild intermittent asthma, uncomplicated: Secondary | ICD-10-CM | POA: Diagnosis not present

## 2014-11-11 DIAGNOSIS — Z1231 Encounter for screening mammogram for malignant neoplasm of breast: Secondary | ICD-10-CM | POA: Diagnosis not present

## 2014-11-11 DIAGNOSIS — I10 Essential (primary) hypertension: Secondary | ICD-10-CM | POA: Diagnosis not present

## 2014-11-11 DIAGNOSIS — F325 Major depressive disorder, single episode, in full remission: Secondary | ICD-10-CM | POA: Diagnosis not present

## 2015-04-06 ENCOUNTER — Other Ambulatory Visit: Payer: Self-pay | Admitting: Internal Medicine

## 2015-04-06 DIAGNOSIS — Z1231 Encounter for screening mammogram for malignant neoplasm of breast: Secondary | ICD-10-CM

## 2015-04-19 ENCOUNTER — Other Ambulatory Visit: Payer: Self-pay | Admitting: Internal Medicine

## 2015-04-19 ENCOUNTER — Ambulatory Visit
Admission: RE | Admit: 2015-04-19 | Discharge: 2015-04-19 | Disposition: A | Discharge status: Home / Self Care | Payer: Commercial Managed Care - HMO | Source: Ambulatory Visit | Attending: Internal Medicine | Admitting: Internal Medicine

## 2015-04-19 DIAGNOSIS — Z1231 Encounter for screening mammogram for malignant neoplasm of breast: Secondary | ICD-10-CM

## 2015-05-26 DIAGNOSIS — I10 Essential (primary) hypertension: Secondary | ICD-10-CM | POA: Diagnosis not present

## 2015-05-26 DIAGNOSIS — N183 Chronic kidney disease, stage 3 (moderate): Secondary | ICD-10-CM | POA: Diagnosis not present

## 2015-05-26 DIAGNOSIS — J4520 Mild intermittent asthma, uncomplicated: Secondary | ICD-10-CM | POA: Diagnosis not present

## 2015-05-26 DIAGNOSIS — E7800 Pure hypercholesterolemia, unspecified: Secondary | ICD-10-CM | POA: Diagnosis not present

## 2015-05-26 DIAGNOSIS — F325 Major depressive disorder, single episode, in full remission: Secondary | ICD-10-CM | POA: Diagnosis not present

## 2015-11-16 DIAGNOSIS — N183 Chronic kidney disease, stage 3 (moderate): Secondary | ICD-10-CM | POA: Diagnosis not present

## 2015-11-16 DIAGNOSIS — E7800 Pure hypercholesterolemia, unspecified: Secondary | ICD-10-CM | POA: Diagnosis not present

## 2015-11-16 DIAGNOSIS — I10 Essential (primary) hypertension: Secondary | ICD-10-CM | POA: Diagnosis not present

## 2015-11-23 DIAGNOSIS — E7800 Pure hypercholesterolemia, unspecified: Secondary | ICD-10-CM | POA: Diagnosis not present

## 2015-11-23 DIAGNOSIS — F325 Major depressive disorder, single episode, in full remission: Secondary | ICD-10-CM | POA: Diagnosis not present

## 2015-11-23 DIAGNOSIS — G43809 Other migraine, not intractable, without status migrainosus: Secondary | ICD-10-CM | POA: Diagnosis not present

## 2015-11-23 DIAGNOSIS — I10 Essential (primary) hypertension: Secondary | ICD-10-CM | POA: Diagnosis not present

## 2015-11-23 DIAGNOSIS — N183 Chronic kidney disease, stage 3 (moderate): Secondary | ICD-10-CM | POA: Diagnosis not present

## 2015-11-23 DIAGNOSIS — J4520 Mild intermittent asthma, uncomplicated: Secondary | ICD-10-CM | POA: Diagnosis not present

## 2016-01-18 DIAGNOSIS — Z86010 Personal history of colonic polyps: Secondary | ICD-10-CM | POA: Diagnosis not present

## 2016-05-20 DIAGNOSIS — E7800 Pure hypercholesterolemia, unspecified: Secondary | ICD-10-CM | POA: Diagnosis not present

## 2016-05-20 DIAGNOSIS — N183 Chronic kidney disease, stage 3 (moderate): Secondary | ICD-10-CM | POA: Diagnosis not present

## 2016-05-20 DIAGNOSIS — Z0000 Encounter for general adult medical examination without abnormal findings: Secondary | ICD-10-CM | POA: Diagnosis not present

## 2016-05-20 DIAGNOSIS — I10 Essential (primary) hypertension: Secondary | ICD-10-CM | POA: Diagnosis not present

## 2016-05-22 ENCOUNTER — Encounter: Payer: Self-pay | Admitting: *Deleted

## 2016-05-23 ENCOUNTER — Ambulatory Visit: Admission: RE | Admit: 2016-05-23 | Payer: Medicare HMO | Source: Ambulatory Visit | Admitting: Gastroenterology

## 2016-05-23 ENCOUNTER — Encounter: Admission: RE | Payer: Self-pay | Source: Ambulatory Visit

## 2016-05-23 HISTORY — DX: Gastro-esophageal reflux disease without esophagitis: K21.9

## 2016-05-23 HISTORY — DX: Headache: R51

## 2016-05-23 HISTORY — DX: Major depressive disorder, single episode, unspecified: F32.9

## 2016-05-23 HISTORY — DX: Essential (primary) hypertension: I10

## 2016-05-23 HISTORY — DX: Chronic kidney disease, unspecified: N18.9

## 2016-05-23 HISTORY — DX: Headache, unspecified: R51.9

## 2016-05-23 HISTORY — DX: Cervical disc disorder, unspecified, unspecified cervical region: M50.90

## 2016-05-23 HISTORY — DX: Unspecified asthma, uncomplicated: J45.909

## 2016-05-23 HISTORY — DX: Depression, unspecified: F32.A

## 2016-05-23 SURGERY — COLONOSCOPY WITH PROPOFOL
Anesthesia: General

## 2016-05-27 DIAGNOSIS — M792 Neuralgia and neuritis, unspecified: Secondary | ICD-10-CM | POA: Diagnosis not present

## 2016-05-27 DIAGNOSIS — J4520 Mild intermittent asthma, uncomplicated: Secondary | ICD-10-CM | POA: Diagnosis not present

## 2016-05-27 DIAGNOSIS — E7800 Pure hypercholesterolemia, unspecified: Secondary | ICD-10-CM | POA: Diagnosis not present

## 2016-05-27 DIAGNOSIS — N184 Chronic kidney disease, stage 4 (severe): Secondary | ICD-10-CM | POA: Diagnosis not present

## 2016-05-27 DIAGNOSIS — I10 Essential (primary) hypertension: Secondary | ICD-10-CM | POA: Diagnosis not present

## 2016-05-27 DIAGNOSIS — F325 Major depressive disorder, single episode, in full remission: Secondary | ICD-10-CM | POA: Diagnosis not present

## 2016-06-24 ENCOUNTER — Ambulatory Visit
Admission: RE | Admit: 2016-06-24 | Discharge: 2016-06-24 | Disposition: A | Discharge status: Home / Self Care | Payer: Medicare HMO | Source: Ambulatory Visit | Attending: Gastroenterology | Admitting: Gastroenterology

## 2016-06-24 ENCOUNTER — Encounter: Payer: Self-pay | Admitting: *Deleted

## 2016-06-24 ENCOUNTER — Ambulatory Visit: Payer: Medicare HMO | Admitting: Anesthesiology

## 2016-06-24 ENCOUNTER — Encounter
Admission: RE | Disposition: A | Discharge status: Home / Self Care | Payer: Self-pay | Source: Ambulatory Visit | Attending: Gastroenterology

## 2016-06-24 DIAGNOSIS — Z7951 Long term (current) use of inhaled steroids: Secondary | ICD-10-CM | POA: Insufficient documentation

## 2016-06-24 DIAGNOSIS — K635 Polyp of colon: Secondary | ICD-10-CM | POA: Insufficient documentation

## 2016-06-24 DIAGNOSIS — N189 Chronic kidney disease, unspecified: Secondary | ICD-10-CM | POA: Insufficient documentation

## 2016-06-24 DIAGNOSIS — F329 Major depressive disorder, single episode, unspecified: Secondary | ICD-10-CM | POA: Diagnosis not present

## 2016-06-24 DIAGNOSIS — K621 Rectal polyp: Secondary | ICD-10-CM | POA: Insufficient documentation

## 2016-06-24 DIAGNOSIS — I129 Hypertensive chronic kidney disease with stage 1 through stage 4 chronic kidney disease, or unspecified chronic kidney disease: Secondary | ICD-10-CM | POA: Insufficient documentation

## 2016-06-24 DIAGNOSIS — Z1211 Encounter for screening for malignant neoplasm of colon: Secondary | ICD-10-CM | POA: Insufficient documentation

## 2016-06-24 DIAGNOSIS — Z86010 Personal history of colonic polyps: Secondary | ICD-10-CM | POA: Diagnosis not present

## 2016-06-24 DIAGNOSIS — I10 Essential (primary) hypertension: Secondary | ICD-10-CM | POA: Diagnosis not present

## 2016-06-24 DIAGNOSIS — J45909 Unspecified asthma, uncomplicated: Secondary | ICD-10-CM | POA: Insufficient documentation

## 2016-06-24 DIAGNOSIS — K5730 Diverticulosis of large intestine without perforation or abscess without bleeding: Secondary | ICD-10-CM | POA: Diagnosis not present

## 2016-06-24 DIAGNOSIS — Z79899 Other long term (current) drug therapy: Secondary | ICD-10-CM | POA: Diagnosis not present

## 2016-06-24 DIAGNOSIS — K5790 Diverticulosis of intestine, part unspecified, without perforation or abscess without bleeding: Secondary | ICD-10-CM | POA: Diagnosis not present

## 2016-06-24 DIAGNOSIS — D128 Benign neoplasm of rectum: Secondary | ICD-10-CM | POA: Diagnosis not present

## 2016-06-24 HISTORY — PX: COLONOSCOPY WITH PROPOFOL: SHX5780

## 2016-06-24 SURGERY — COLONOSCOPY WITH PROPOFOL
Anesthesia: General

## 2016-06-24 MED ORDER — SODIUM CHLORIDE 0.9 % IV SOLN
INTRAVENOUS | Status: DC
  Administered 2016-06-24: 1000 mL via INTRAVENOUS

## 2016-06-24 MED ORDER — PROPOFOL 500 MG/50ML IV EMUL
INTRAVENOUS | Status: DC | PRN
  Administered 2016-06-24: 100 ug/kg/min via INTRAVENOUS

## 2016-06-24 MED ORDER — PHENYLEPHRINE HCL 10 MG/ML IJ SOLN
INTRAMUSCULAR | Status: DC | PRN
  Administered 2016-06-24 (×2): 100 ug via INTRAVENOUS

## 2016-06-24 MED ORDER — LIDOCAINE HCL (PF) 2 % IJ SOLN
INTRAMUSCULAR | Status: DC | PRN
  Administered 2016-06-24: 60 mg via INTRADERMAL

## 2016-06-24 MED ORDER — PROPOFOL 10 MG/ML IV BOLUS
INTRAVENOUS | Status: DC | PRN
Start: 2016-06-24 — End: 2016-06-24
  Administered 2016-06-24: 40 mg via INTRAVENOUS

## 2016-06-24 NOTE — Transfer of Care (Signed)
Immediate Anesthesia Transfer of Care Note  Patient: Amy Vazquez  Procedure(s) Performed: Procedure(s): COLONOSCOPY WITH PROPOFOL (N/A)  Patient Location: PACU  Anesthesia Type:General  Level of Consciousness: awake and alert   Airway & Oxygen Therapy: Patient Spontanous Breathing and Patient connected to nasal cannula oxygen  Post-op Assessment: Report given to RN and Post -op Vital signs reviewed and stable  Post vital signs: Reviewed and stable  Last Vitals:  Vitals:   06/24/16 1222 06/24/16 1340  BP: 131/72 102/83  Pulse: 69 80  Resp: 16 16  Temp: 36.3 C     Last Pain:  Vitals:   06/24/16 1222  TempSrc: Tympanic         Complications: No apparent anesthesia complications

## 2016-06-24 NOTE — Anesthesia Procedure Notes (Signed)
Performed by: Carmine Carrozza Pre-anesthesia Checklist: Patient identified, Emergency Drugs available, Suction available, Patient being monitored and Timeout performed Patient Re-evaluated:Patient Re-evaluated prior to inductionOxygen Delivery Method: Nasal cannula Preoxygenation: Pre-oxygenation with 100% oxygen Intubation Type: IV induction       

## 2016-06-24 NOTE — Anesthesia Preprocedure Evaluation (Signed)
Anesthesia Evaluation  Patient identified by MRN, date of birth, ID band Patient awake    Reviewed: Allergy & Precautions, NPO status , Patient's Chart, lab work & pertinent test results  History of Anesthesia Complications Negative for: history of anesthetic complications  Airway Mallampati: II  TM Distance: >3 FB Neck ROM: Full    Dental  (+) Poor Dentition   Pulmonary asthma , neg sleep apnea, former smoker,    breath sounds clear to auscultation- rhonchi (-) wheezing      Cardiovascular Exercise Tolerance: Good hypertension, Pt. on medications (-) CAD and (-) Past MI  Rhythm:Regular Rate:Normal - Systolic murmurs and - Diastolic murmurs    Neuro/Psych  Headaches, PSYCHIATRIC DISORDERS Depression    GI/Hepatic Neg liver ROS, GERD  ,  Endo/Other  negative endocrine ROSneg diabetes  Renal/GU Renal InsufficiencyRenal disease     Musculoskeletal negative musculoskeletal ROS (+)   Abdominal (+) - obese,   Peds  Hematology negative hematology ROS (+)   Anesthesia Other Findings Past Medical History: No date: Asthma No date: Cervical disc disease No date: Chronic kidney disease No date: Depression No date: GERD (gastroesophageal reflux disease) No date: Headache No date: Hypertension   Reproductive/Obstetrics                             Anesthesia Physical Anesthesia Plan  ASA: III  Anesthesia Plan: General   Post-op Pain Management:    Induction: Intravenous  Airway Management Planned: Natural Airway  Additional Equipment:   Intra-op Plan:   Post-operative Plan:   Informed Consent: I have reviewed the patients History and Physical, chart, labs and discussed the procedure including the risks, benefits and alternatives for the proposed anesthesia with the patient or authorized representative who has indicated his/her understanding and acceptance.   Dental advisory  given  Plan Discussed with: CRNA and Anesthesiologist  Anesthesia Plan Comments:         Anesthesia Quick Evaluation

## 2016-06-24 NOTE — H&P (Signed)
Outpatient short stay form Pre-procedure 06/24/2016 12:24 PM Lollie Sails MD  Primary Physician: Dr. Frazier Richards  Reason for visit:  Colonoscopy  History of present illness:  Patient is a 71 year old female presenting today as above. She has a personal history of adenomatous colon polyps removed in 2012. She tolerated her prep well. She takes no aspirin or blood thinning agents.    Current Facility-Administered Medications:  .  0.9 %  sodium chloride infusion, , Intravenous, Continuous, Lollie Sails, MD  Prescriptions Prior to Admission  Medication Sig Dispense Refill Last Dose  . budesonide-formoterol (SYMBICORT) 80-4.5 MCG/ACT inhaler Inhale 2 puffs into the lungs 2 (two) times daily.   06/23/2016 at Unknown time  . buPROPion (WELLBUTRIN XL) 150 MG 24 hr tablet Take 150 mg by mouth daily.   06/23/2016 at Unknown time  . lisinopril-hydrochlorothiazide (PRINZIDE,ZESTORETIC) 20-25 MG tablet Take 1 tablet by mouth daily.   06/23/2016 at Unknown time  . pravastatin (PRAVACHOL) 40 MG tablet Take 40 mg by mouth daily.   06/23/2016 at Unknown time  . sertraline (ZOLOFT) 100 MG tablet Take 100 mg by mouth daily.   06/23/2016 at Unknown time  . verapamil (CALAN-SR) 180 MG CR tablet Take 180 mg by mouth at bedtime.   06/23/2016 at Unknown time     No Known Allergies   Past Medical History:  Diagnosis Date  . Asthma   . Cervical disc disease   . Chronic kidney disease   . Depression   . GERD (gastroesophageal reflux disease)   . Headache   . Hypertension     Review of systems:      Physical Exam    Heart and lungs: Regular rate and rhythm without rub or gallop, lungs are bilaterally clear.    HEENT: Normocephalic atraumatic eyes are anicteric    Other:     Pertinant exam for procedure: Soft nontender nondistended bowel sounds positive normoactive.    Planned proceedures: Colonoscopy and indicated procedures. Outpatient short stay form Pre-procedure 06/24/2016  12:26 PM Lollie Sails MD  Lollie Sails, MD Gastroenterology

## 2016-06-24 NOTE — Op Note (Signed)
[facility] Patient Name: Amy Vazquez Procedure Date: 06/24/2016 1:05 PM MRN: [id number] Account #: [id number] Date of Birth: 1945-07-27 Admit Type: Outpatient Age: 71 Room: [facility] ENDO ROOM 3 Gender: Female Note Status: Finalized Procedure:            Colonoscopy Indications:          Personal history of colonic polyps Providers:            Lollie Sails, MD Referring MD:         Ocie Cornfield. Ouida Sills MD, MD (Referring MD) Medicines:            Monitored Anesthesia Care Complications:        No immediate complications. Procedure:            Pre-Anesthesia Assessment:                       - ASA Grade Assessment: III - A patient with severe                        systemic disease.                       After obtaining informed consent, the colonoscope was                        passed under direct vision. Throughout the procedure,                        the patient's blood pressure, pulse, and oxygen                        saturations were monitored continuously. The                        Colonoscope was introduced through the anus and                        advanced to the the cecum, identified by appendiceal                        orifice and ileocecal valve. The colonoscopy was                        performed without difficulty. The patient tolerated the                        procedure well. The quality of the bowel preparation                        was good. Findings:      Three sessile polyps were found in the rectum. The polyps were 1 to 2 mm       in size. These polyps were removed with a cold biopsy forceps. Resection       and retrieval were complete.      A 4 mm polyp was found in the distal sigmoid colon. The polyp was       pedunculated. The polyp was removed with a cold snare. Resection and       retrieval were complete.      A few medium-mouthed diverticula were found in the sigmoid colon and  descending colon.  The retroflexed view of the distal rectum and anal verge was normal and       showed no anal or rectal abnormalities.      The exam was otherwise without abnormality. Impression:           - Three 1 to 2 mm polyps in the rectum, removed with a                        cold biopsy forceps. Resected and retrieved.                       - One 4 mm polyp in the distal sigmoid colon, removed                        with a cold snare. Resected and retrieved.                       - Diverticulosis in the sigmoid colon and in the                        descending colon.                       - The distal rectum and anal verge are normal on                        retroflexion view.                       - The examination was otherwise normal. Recommendation:       - Discharge patient to home.                       - Advance diet as tolerated. Procedure Code(s):    --- Professional ---                       [phone], Colonoscopy, flexible; with removal of tumor(s),                        polyp(s), or other lesion(s) by snare technique                       [id number], 9, Colonoscopy, flexible; with biopsy, single                        or multiple Diagnosis Code(s):    --- Professional ---                       K62.1, Rectal polyp                       D12.5, Benign neoplasm of sigmoid colon                       Z86.010, Personal history of colonic polyps                       K57.30, Diverticulosis of large intestine without                        perforation or abscess without  bleeding CPT copyright 2016 [employer]. All rights reserved. The codes documented in this report are preliminary and upon coder review may  be revised to meet current compliance requirements. Lollie Sails, MD 06/24/2016 1:41:43 PM This report has been signed electronically. Number of Addenda: 0 Note Initiated On: 06/24/2016 1:05 PM Scope Withdrawal Time: 0 hours 11 minutes 55 seconds  Total Procedure  Duration: 0 hours 24 minutes 10 seconds       [facility]

## 2016-06-24 NOTE — Anesthesia Post-op Follow-up Note (Cosign Needed)
Anesthesia QCDR form completed.        

## 2016-06-24 NOTE — Anesthesia Postprocedure Evaluation (Signed)
Anesthesia Post Note  Patient: Amy Vazquez  Procedure(s) Performed: Procedure(s) (LRB): COLONOSCOPY WITH PROPOFOL (N/A)  Patient location during evaluation: Endoscopy Anesthesia Type: General Level of consciousness: awake and alert and oriented Pain management: pain level controlled Vital Signs Assessment: post-procedure vital signs reviewed and stable Respiratory status: spontaneous breathing, nonlabored ventilation and respiratory function stable Cardiovascular status: blood pressure returned to baseline and stable Postop Assessment: no signs of nausea or vomiting Anesthetic complications: no     Last Vitals:  Vitals:   06/24/16 1400 06/24/16 1410  BP: 125/64 122/62  Pulse: 78 69  Resp: 18 15  Temp:      Last Pain:  Vitals:   06/24/16 1340  TempSrc: Tympanic                 Kari Montero

## 2016-06-25 ENCOUNTER — Encounter: Payer: Self-pay | Admitting: Gastroenterology

## 2016-06-25 LAB — SURGICAL PATHOLOGY

## 2016-11-19 DIAGNOSIS — E538 Deficiency of other specified B group vitamins: Secondary | ICD-10-CM | POA: Diagnosis not present

## 2016-11-19 DIAGNOSIS — E039 Hypothyroidism, unspecified: Secondary | ICD-10-CM | POA: Diagnosis not present

## 2016-11-19 DIAGNOSIS — M792 Neuralgia and neuritis, unspecified: Secondary | ICD-10-CM | POA: Diagnosis not present

## 2016-11-19 DIAGNOSIS — R740 Nonspecific elevation of levels of transaminase and lactic acid dehydrogenase [LDH]: Secondary | ICD-10-CM | POA: Diagnosis not present

## 2016-11-26 DIAGNOSIS — E7800 Pure hypercholesterolemia, unspecified: Secondary | ICD-10-CM | POA: Diagnosis not present

## 2016-11-26 DIAGNOSIS — I10 Essential (primary) hypertension: Secondary | ICD-10-CM | POA: Diagnosis not present

## 2016-11-26 DIAGNOSIS — E538 Deficiency of other specified B group vitamins: Secondary | ICD-10-CM | POA: Diagnosis not present

## 2016-11-26 DIAGNOSIS — F325 Major depressive disorder, single episode, in full remission: Secondary | ICD-10-CM | POA: Diagnosis not present

## 2016-11-26 DIAGNOSIS — Z0000 Encounter for general adult medical examination without abnormal findings: Secondary | ICD-10-CM | POA: Diagnosis not present

## 2016-11-26 DIAGNOSIS — J4520 Mild intermittent asthma, uncomplicated: Secondary | ICD-10-CM | POA: Diagnosis not present

## 2016-12-27 DIAGNOSIS — E538 Deficiency of other specified B group vitamins: Secondary | ICD-10-CM | POA: Diagnosis not present

## 2017-01-28 DIAGNOSIS — E538 Deficiency of other specified B group vitamins: Secondary | ICD-10-CM | POA: Diagnosis not present

## 2017-02-28 DIAGNOSIS — E538 Deficiency of other specified B group vitamins: Secondary | ICD-10-CM | POA: Diagnosis not present

## 2017-05-22 DIAGNOSIS — E7800 Pure hypercholesterolemia, unspecified: Secondary | ICD-10-CM | POA: Diagnosis not present

## 2017-05-22 DIAGNOSIS — Z0000 Encounter for general adult medical examination without abnormal findings: Secondary | ICD-10-CM | POA: Diagnosis not present

## 2017-05-22 DIAGNOSIS — E538 Deficiency of other specified B group vitamins: Secondary | ICD-10-CM | POA: Diagnosis not present

## 2017-05-29 DIAGNOSIS — E7800 Pure hypercholesterolemia, unspecified: Secondary | ICD-10-CM | POA: Diagnosis not present

## 2017-05-29 DIAGNOSIS — J4520 Mild intermittent asthma, uncomplicated: Secondary | ICD-10-CM | POA: Diagnosis not present

## 2017-05-29 DIAGNOSIS — F325 Major depressive disorder, single episode, in full remission: Secondary | ICD-10-CM | POA: Diagnosis not present

## 2017-05-29 DIAGNOSIS — M50323 Other cervical disc degeneration at C6-C7 level: Secondary | ICD-10-CM | POA: Diagnosis not present

## 2017-05-29 DIAGNOSIS — M50321 Other cervical disc degeneration at C4-C5 level: Secondary | ICD-10-CM | POA: Diagnosis not present

## 2017-05-29 DIAGNOSIS — I10 Essential (primary) hypertension: Secondary | ICD-10-CM | POA: Diagnosis not present

## 2017-05-29 DIAGNOSIS — N184 Chronic kidney disease, stage 4 (severe): Secondary | ICD-10-CM | POA: Diagnosis not present

## 2017-05-29 DIAGNOSIS — M50322 Other cervical disc degeneration at C5-C6 level: Secondary | ICD-10-CM | POA: Diagnosis not present

## 2017-05-29 DIAGNOSIS — E538 Deficiency of other specified B group vitamins: Secondary | ICD-10-CM | POA: Diagnosis not present

## 2017-05-29 DIAGNOSIS — M542 Cervicalgia: Secondary | ICD-10-CM | POA: Diagnosis not present

## 2017-10-29 DIAGNOSIS — J40 Bronchitis, not specified as acute or chronic: Secondary | ICD-10-CM | POA: Diagnosis not present

## 2017-10-29 DIAGNOSIS — J029 Acute pharyngitis, unspecified: Secondary | ICD-10-CM | POA: Diagnosis not present

## 2017-10-29 DIAGNOSIS — R05 Cough: Secondary | ICD-10-CM | POA: Diagnosis not present

## 2017-11-27 DIAGNOSIS — I10 Essential (primary) hypertension: Secondary | ICD-10-CM | POA: Diagnosis not present

## 2017-11-27 DIAGNOSIS — E538 Deficiency of other specified B group vitamins: Secondary | ICD-10-CM | POA: Diagnosis not present

## 2017-11-27 DIAGNOSIS — E7800 Pure hypercholesterolemia, unspecified: Secondary | ICD-10-CM | POA: Diagnosis not present

## 2017-12-12 DIAGNOSIS — J4520 Mild intermittent asthma, uncomplicated: Secondary | ICD-10-CM | POA: Diagnosis not present

## 2017-12-12 DIAGNOSIS — I10 Essential (primary) hypertension: Secondary | ICD-10-CM | POA: Diagnosis not present

## 2017-12-12 DIAGNOSIS — E7800 Pure hypercholesterolemia, unspecified: Secondary | ICD-10-CM | POA: Diagnosis not present

## 2017-12-12 DIAGNOSIS — N184 Chronic kidney disease, stage 4 (severe): Secondary | ICD-10-CM | POA: Diagnosis not present

## 2017-12-12 DIAGNOSIS — Z0000 Encounter for general adult medical examination without abnormal findings: Secondary | ICD-10-CM | POA: Diagnosis not present

## 2017-12-12 DIAGNOSIS — E538 Deficiency of other specified B group vitamins: Secondary | ICD-10-CM | POA: Diagnosis not present

## 2017-12-12 DIAGNOSIS — Z5320 Procedure and treatment not carried out because of patient's decision for unspecified reasons: Secondary | ICD-10-CM | POA: Diagnosis not present

## 2017-12-12 DIAGNOSIS — F325 Major depressive disorder, single episode, in full remission: Secondary | ICD-10-CM | POA: Diagnosis not present

## 2018-03-11 DIAGNOSIS — H35341 Macular cyst, hole, or pseudohole, right eye: Secondary | ICD-10-CM | POA: Diagnosis not present

## 2018-04-06 DIAGNOSIS — D3131 Benign neoplasm of right choroid: Secondary | ICD-10-CM | POA: Diagnosis not present

## 2018-04-06 DIAGNOSIS — H35341 Macular cyst, hole, or pseudohole, right eye: Secondary | ICD-10-CM | POA: Diagnosis not present

## 2018-04-06 DIAGNOSIS — H05341 Enlargement of right orbit: Secondary | ICD-10-CM | POA: Diagnosis not present

## 2018-06-08 DIAGNOSIS — J4520 Mild intermittent asthma, uncomplicated: Secondary | ICD-10-CM | POA: Diagnosis not present

## 2018-06-08 DIAGNOSIS — E7800 Pure hypercholesterolemia, unspecified: Secondary | ICD-10-CM | POA: Diagnosis not present

## 2018-06-08 DIAGNOSIS — N184 Chronic kidney disease, stage 4 (severe): Secondary | ICD-10-CM | POA: Diagnosis not present

## 2018-06-08 DIAGNOSIS — I10 Essential (primary) hypertension: Secondary | ICD-10-CM | POA: Diagnosis not present

## 2018-06-15 DIAGNOSIS — E7800 Pure hypercholesterolemia, unspecified: Secondary | ICD-10-CM | POA: Diagnosis not present

## 2018-06-15 DIAGNOSIS — F325 Major depressive disorder, single episode, in full remission: Secondary | ICD-10-CM | POA: Diagnosis not present

## 2018-06-15 DIAGNOSIS — N184 Chronic kidney disease, stage 4 (severe): Secondary | ICD-10-CM | POA: Diagnosis not present

## 2018-06-15 DIAGNOSIS — I10 Essential (primary) hypertension: Secondary | ICD-10-CM | POA: Diagnosis not present

## 2018-06-15 DIAGNOSIS — J4520 Mild intermittent asthma, uncomplicated: Secondary | ICD-10-CM | POA: Diagnosis not present

## 2018-06-15 DIAGNOSIS — N2581 Secondary hyperparathyroidism of renal origin: Secondary | ICD-10-CM | POA: Diagnosis not present

## 2018-06-15 DIAGNOSIS — E538 Deficiency of other specified B group vitamins: Secondary | ICD-10-CM | POA: Diagnosis not present

## 2018-07-16 DIAGNOSIS — R05 Cough: Secondary | ICD-10-CM | POA: Diagnosis not present

## 2018-07-16 DIAGNOSIS — J40 Bronchitis, not specified as acute or chronic: Secondary | ICD-10-CM | POA: Diagnosis not present

## 2018-07-16 DIAGNOSIS — R6889 Other general symptoms and signs: Secondary | ICD-10-CM | POA: Diagnosis not present

## 2018-07-16 DIAGNOSIS — R062 Wheezing: Secondary | ICD-10-CM | POA: Diagnosis not present

## 2018-07-16 DIAGNOSIS — R509 Fever, unspecified: Secondary | ICD-10-CM | POA: Diagnosis not present

## 2018-11-23 DIAGNOSIS — L239 Allergic contact dermatitis, unspecified cause: Secondary | ICD-10-CM | POA: Diagnosis not present

## 2018-11-23 DIAGNOSIS — B354 Tinea corporis: Secondary | ICD-10-CM | POA: Diagnosis not present

## 2018-12-01 DIAGNOSIS — H35341 Macular cyst, hole, or pseudohole, right eye: Secondary | ICD-10-CM | POA: Diagnosis not present

## 2018-12-08 DIAGNOSIS — N184 Chronic kidney disease, stage 4 (severe): Secondary | ICD-10-CM | POA: Diagnosis not present

## 2018-12-08 DIAGNOSIS — E7800 Pure hypercholesterolemia, unspecified: Secondary | ICD-10-CM | POA: Diagnosis not present

## 2018-12-08 DIAGNOSIS — I10 Essential (primary) hypertension: Secondary | ICD-10-CM | POA: Diagnosis not present

## 2018-12-08 DIAGNOSIS — E538 Deficiency of other specified B group vitamins: Secondary | ICD-10-CM | POA: Diagnosis not present

## 2018-12-15 DIAGNOSIS — Z5320 Procedure and treatment not carried out because of patient's decision for unspecified reasons: Secondary | ICD-10-CM | POA: Diagnosis not present

## 2018-12-15 DIAGNOSIS — F325 Major depressive disorder, single episode, in full remission: Secondary | ICD-10-CM | POA: Diagnosis not present

## 2018-12-15 DIAGNOSIS — N184 Chronic kidney disease, stage 4 (severe): Secondary | ICD-10-CM | POA: Diagnosis not present

## 2018-12-15 DIAGNOSIS — J4520 Mild intermittent asthma, uncomplicated: Secondary | ICD-10-CM | POA: Diagnosis not present

## 2018-12-15 DIAGNOSIS — Z0000 Encounter for general adult medical examination without abnormal findings: Secondary | ICD-10-CM | POA: Diagnosis not present

## 2018-12-15 DIAGNOSIS — N2581 Secondary hyperparathyroidism of renal origin: Secondary | ICD-10-CM | POA: Diagnosis not present

## 2018-12-15 DIAGNOSIS — E7800 Pure hypercholesterolemia, unspecified: Secondary | ICD-10-CM | POA: Diagnosis not present

## 2018-12-15 DIAGNOSIS — I10 Essential (primary) hypertension: Secondary | ICD-10-CM | POA: Diagnosis not present

## 2018-12-15 DIAGNOSIS — K219 Gastro-esophageal reflux disease without esophagitis: Secondary | ICD-10-CM | POA: Diagnosis not present

## 2019-02-05 DIAGNOSIS — R202 Paresthesia of skin: Secondary | ICD-10-CM | POA: Diagnosis not present

## 2019-02-05 DIAGNOSIS — R258 Other abnormal involuntary movements: Secondary | ICD-10-CM | POA: Diagnosis not present

## 2019-02-05 DIAGNOSIS — R200 Anesthesia of skin: Secondary | ICD-10-CM | POA: Diagnosis not present

## 2019-02-05 DIAGNOSIS — E559 Vitamin D deficiency, unspecified: Secondary | ICD-10-CM | POA: Diagnosis not present

## 2019-02-05 DIAGNOSIS — R292 Abnormal reflex: Secondary | ICD-10-CM | POA: Diagnosis not present

## 2019-02-15 ENCOUNTER — Other Ambulatory Visit (HOSPITAL_COMMUNITY): Payer: Self-pay | Admitting: Neurology

## 2019-02-15 ENCOUNTER — Other Ambulatory Visit: Payer: Self-pay | Admitting: Neurology

## 2019-02-15 DIAGNOSIS — R292 Abnormal reflex: Secondary | ICD-10-CM

## 2019-02-25 ENCOUNTER — Ambulatory Visit
Admission: RE | Admit: 2019-02-25 | Discharge: 2019-02-25 | Disposition: A | Discharge status: Home / Self Care | Payer: Medicare HMO | Source: Ambulatory Visit | Attending: Neurology | Admitting: Neurology

## 2019-02-25 ENCOUNTER — Other Ambulatory Visit: Payer: Self-pay

## 2019-02-25 DIAGNOSIS — R202 Paresthesia of skin: Secondary | ICD-10-CM | POA: Diagnosis not present

## 2019-02-25 DIAGNOSIS — R292 Abnormal reflex: Secondary | ICD-10-CM | POA: Diagnosis not present

## 2019-02-25 DIAGNOSIS — M4802 Spinal stenosis, cervical region: Secondary | ICD-10-CM | POA: Diagnosis not present

## 2019-02-25 DIAGNOSIS — R200 Anesthesia of skin: Secondary | ICD-10-CM | POA: Diagnosis not present

## 2019-02-25 IMAGING — MR MR CERVICAL SPINE W/O CM
5 series · 35 of 48 positions shown · non-contrast
Comparison: None.

CLINICAL DATA: Numbness and tingling of both hands and feet over
the last 6 months.

EXAM:
MRI CERVICAL SPINE WITHOUT CONTRAST
TECHNIQUE: Multiplanar, multisequence MR imaging of the cervical spine was
performed. No intravenous contrast was administered.

[Series 5: T2 · sagittal · 3.0mm · 0.62mm/px · 6 of 15 slices shown (1 of 2)]
[im 1/15]
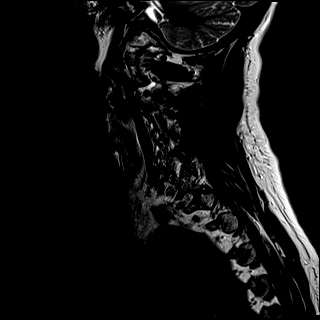
[im 3/15]
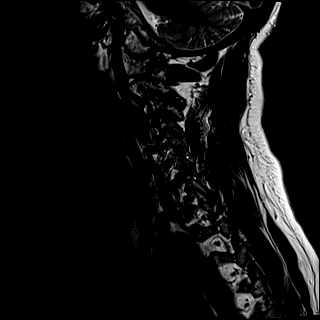
[im 6/15]
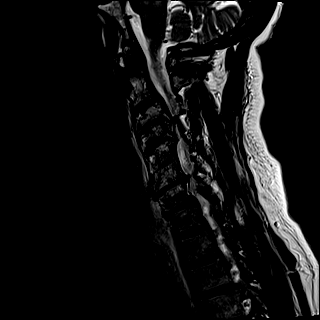
[im 9/15]
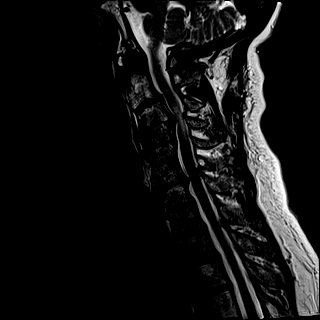
[im 12/15]
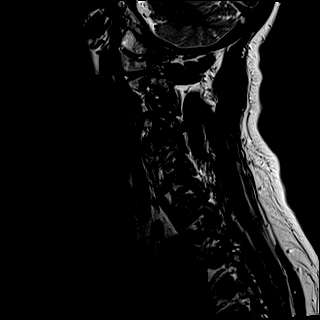
[im 15/15]
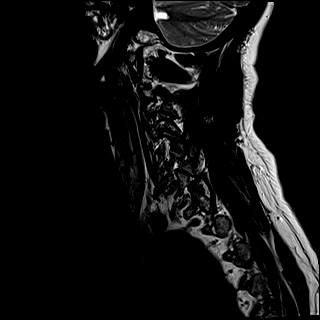

[Series 6: FLAIR · sagittal · 3.0mm · 0.78mm/px · 7 of 15 slices shown]
[im 1/15]
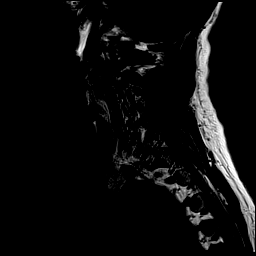
[im 3/15]
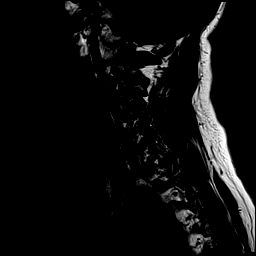
[im 5/15]
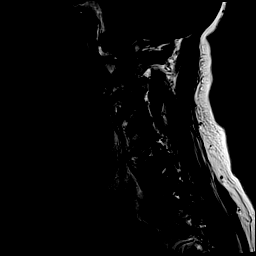
[im 8/15]
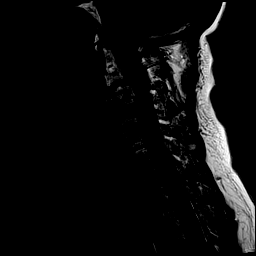
[im 10/15]
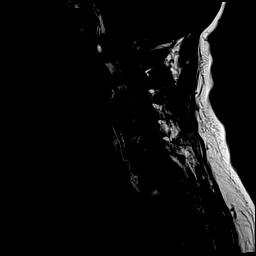
[im 12/15]
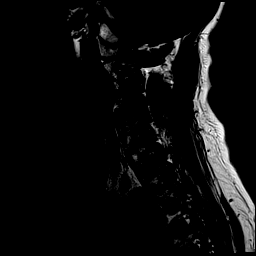
[im 15/15]
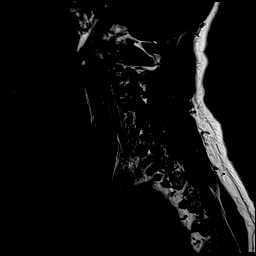

[Series 7: STIR · sagittal · 3.0mm · 0.62mm/px · 7 of 15 slices shown]
[im 1/15]
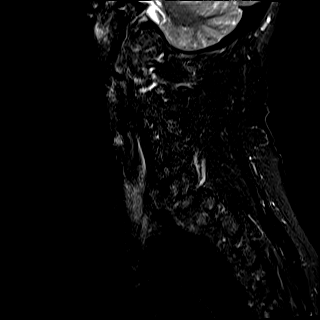
[im 3/15]
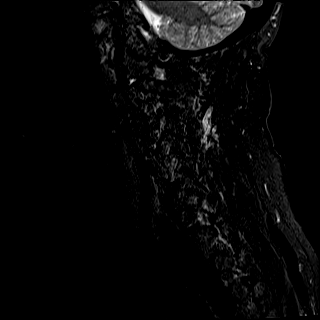
[im 5/15]
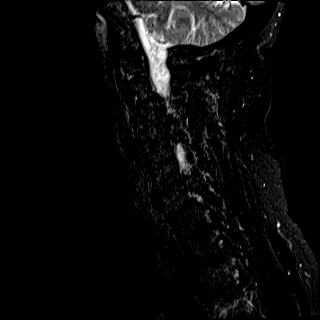
[im 8/15]
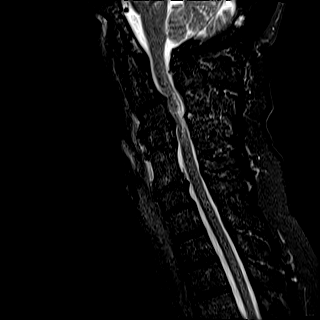
[im 10/15]
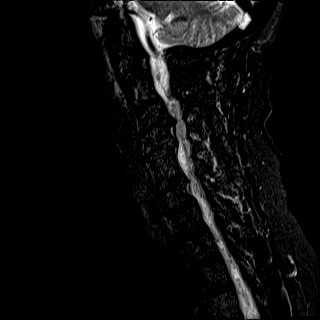
[im 12/15]
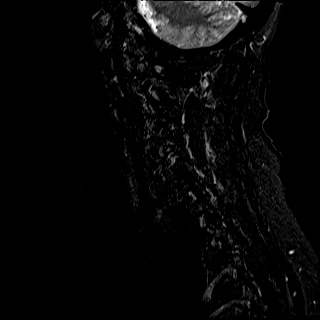
[im 15/15]
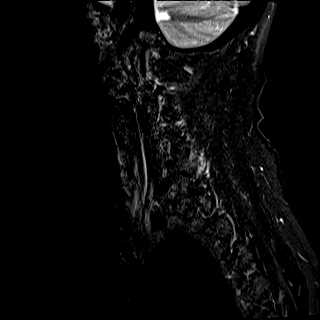

[Series 8: T2 · axial · 3.0mm · 0.70mm/px · z∈[-248,-154]mm · 8 of 29 slices shown (2 of 2)]
[im 1/29]
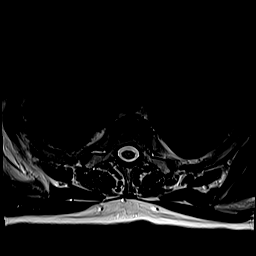
[im 5/29]
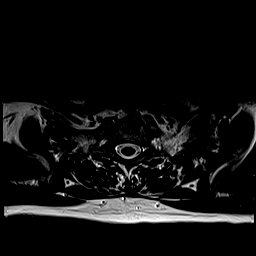
[im 9/29]
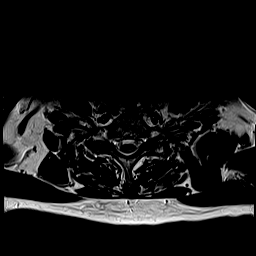
[im 13/29]
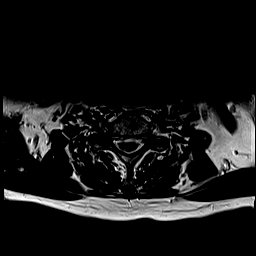
[im 16/29]
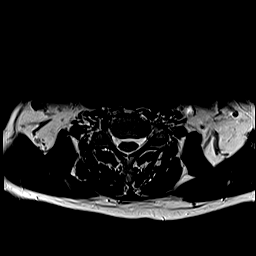
[im 20/29]
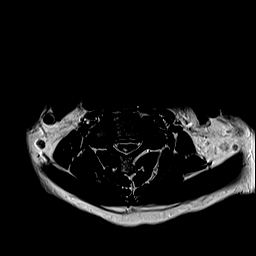
[im 24/29]
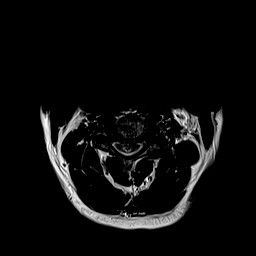
[im 29/29]
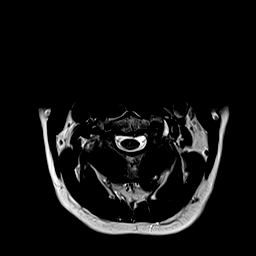

[Series 9: ax mpgr · axial · 3.0mm · 0.35mm/px · z∈[-248,-170]mm · 7 of 29 slices shown]
[im 1/29]
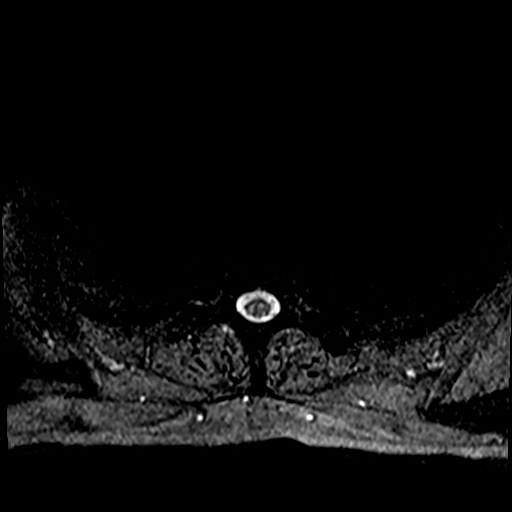
[im 5/29]
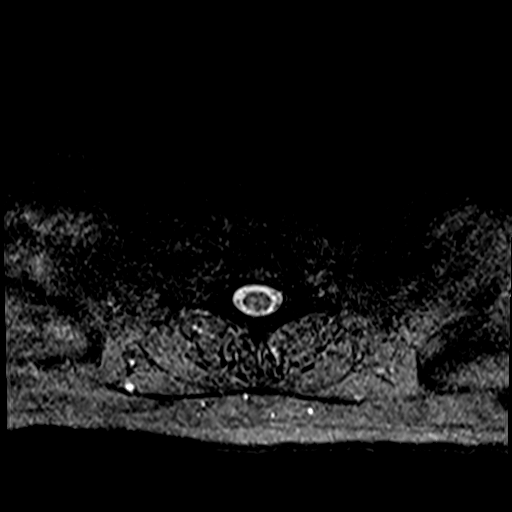
[im 9/29]
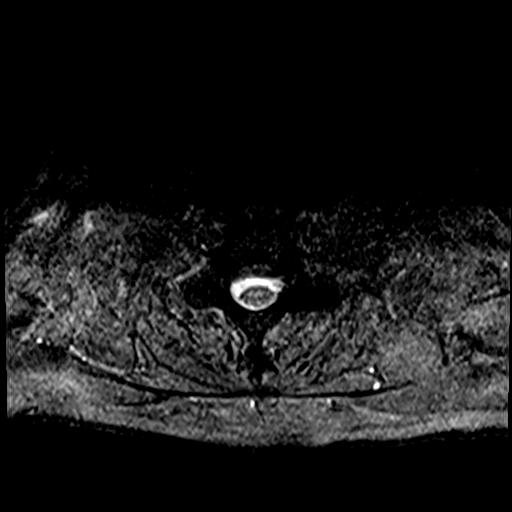
[im 13/29]
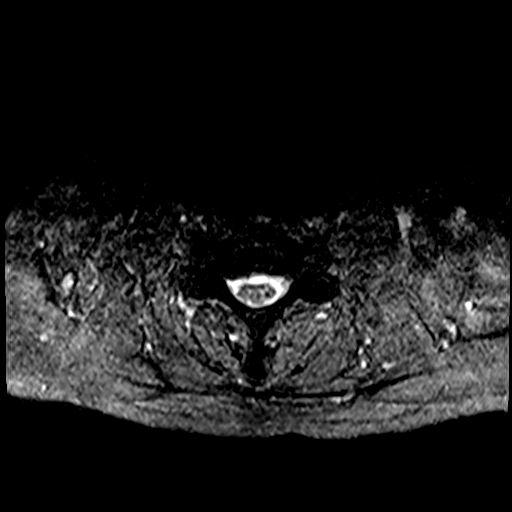
[im 16/29]
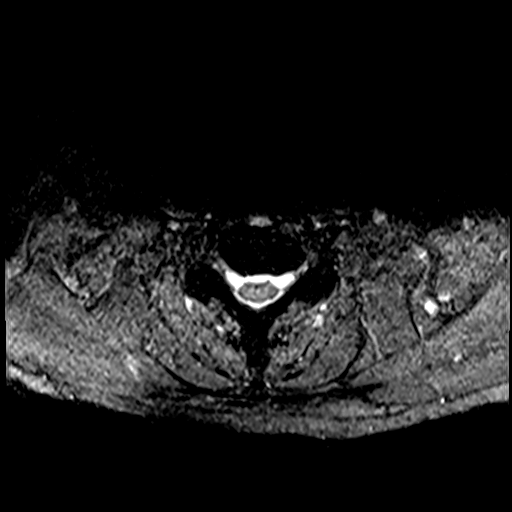
[im 20/29]
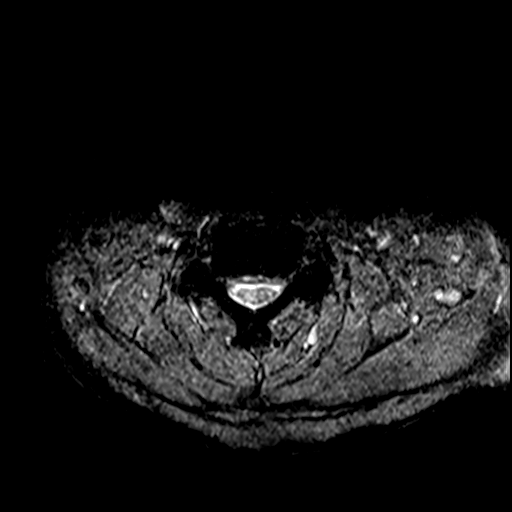
[im 24/29]
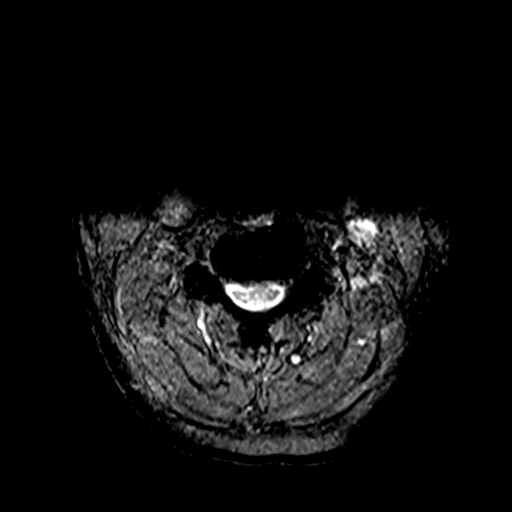

[35 of 48 positions shown; findings below may reference images not displayed]

FINDINGS: Alignment: Straightening of the normal cervical lordosis. 2 mm
degenerative anterolisthesis C2-3. 2 mm degenerative anterolisthesis
C7-T1.

Vertebrae: Distant ACDF C5-6.

Cord: Cord compression and cord edema at the C3-4 level.  See below.

Posterior Fossa, vertebral arteries, paraspinal tissues: Negative

Disc levels:

Foramen magnum is sufficiently patent. There is ordinary
osteoarthritis of the C1-2 articulation.

C2-3: Facet arthropathy left worse than right with 2 mm of
anterolisthesis. Central disc herniation. Narrowing of the canal
with AP diameter of 8-9 mm. Effacement of the subarachnoid space but
no cord compression. Foraminal narrowing left worse than right.

C3-4: Degenerative spondylosis with endplate osteophytes and
protruding disc material. Ligamentous hypertrophy. Severe spinal
stenosis with AP diameter 4 mm. Effacement of the subarachnoid space
and flattening of the cord without abnormal cord signal. This quite
likely explains the clinical presentation. Bilateral foraminal
stenosis could compress both C4 nerves.

C4-5: Spondylosis with endplate osteophytes and bulging of the disc.
Narrowing of the subarachnoid space but no compression of cord. AP
diameter of the canal 8.5 mm in the midline. Bilateral foraminal
stenosis could compress either C5 nerve.

C5-6: Distant ACDF shows solid fusion with wide patency of the canal
and foramina.

C6-7: Spondylosis with endplate osteophytes and bulging disc
material. Narrowing of the subarachnoid space but no compression of
the cord. AP diameter of the canal in the midline 8.5 mm. Bilateral
foraminal stenosis could compress both C7 nerves.

C7-T1: Facet osteoarthritis with 2 mm of anterolisthesis. Mild
bulging of the disc. No central canal stenosis. Mild bilateral
foraminal narrowing without definite C8 nerve compression.
IMPRESSION: Distant ACDF C5-6 as a good appearance with solid union and wide
patency of the canal and foramina.

C3-4: Severe spinal stenosis due to spondylosis and
facet/ligamentous prominence. AP diameter of the canal only 4 mm.
Effacement of the subarachnoid space with flattening of the cord and
abnormal T2 signal within the cord at this level. Bilateral
foraminal stenosis could compress both C4 nerves. At least
consideration of spinal decompression seems warranted.

C2-3: Facet arthropathy left worse than right with 2 mm of
anterolisthesis. Spondylosis and spinal stenosis with AP diameter
8-9 mm. No cord compression. Foraminal stenosis left worse than
right could cause neural compression.

C4-5: Spondylosis. Mild spinal stenosis AP diameter 8-9 mm. No cord
compression. Bilateral foraminal stenosis could compress either or
both C5 nerves.

C6-7: Spondylosis. Canal diameter 8.5 mm. Severe foraminal stenosis
could compress both C7 nerves.

C7-T1: Facet arthritis with 2 mm of anterolisthesis. Mild bilateral
foraminal narrowing, not definitely compressive.

## 2019-02-25 IMAGING — MR MR HEAD W/O CM
7 of 9 series · 33 of 48 positions shown · non-contrast
Comparison: Head CT [DATE]

CLINICAL DATA: Numbness and tingling of the hands and feet over the
last 6 months.

EXAM:
MRI HEAD WITHOUT CONTRAST
TECHNIQUE: Multiplanar, multiecho pulse sequences of the brain and surrounding
structures were obtained without intravenous contrast.

[Series 5: ax dwi_tracew · axial · 3.0mm · 0.60mm/px · z∈[-97,+57]mm · 5 of 48 slices shown]
[im 1/48]
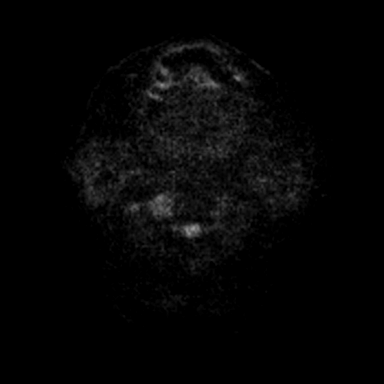
[im 12/48]
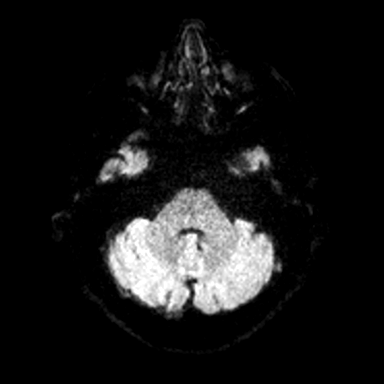
[im 24/48]
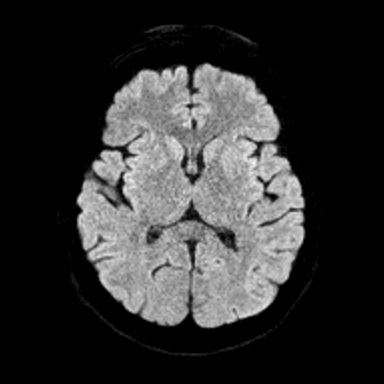
[im 36/48]
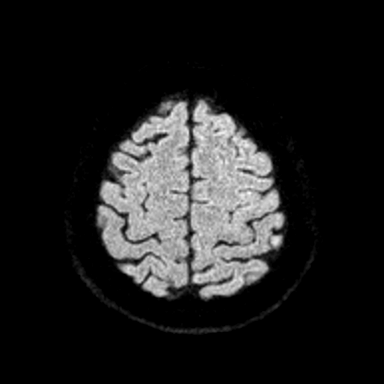
[im 48/48]
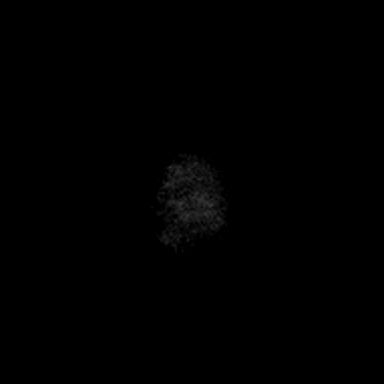

[Series 6: ax dwi_adc · axial · 3.0mm · 0.60mm/px · z∈[-97,+57]mm · 6 of 48 slices shown]
[im 1/48]
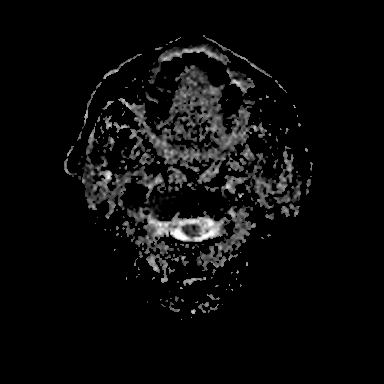
[im 10/48]
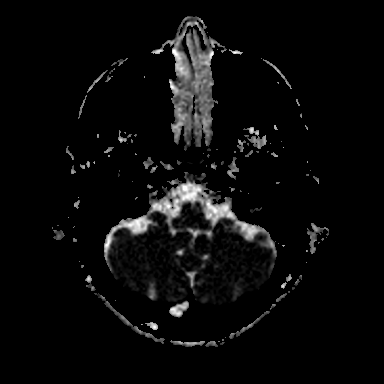
[im 19/48]
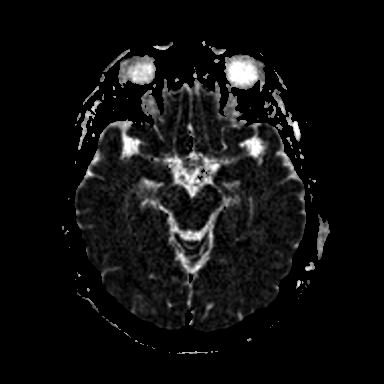
[im 29/48]
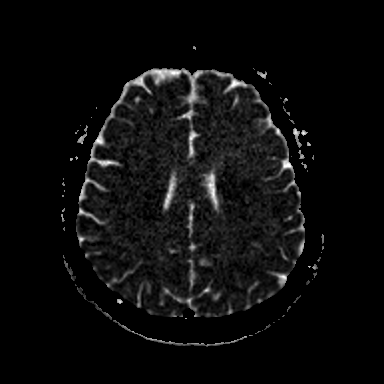
[im 38/48]
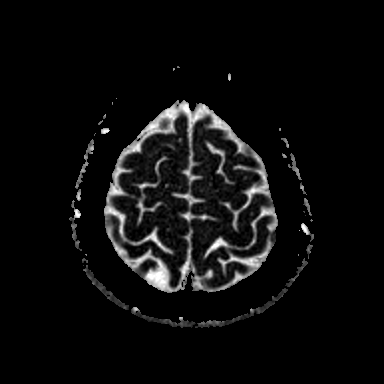
[im 48/48]
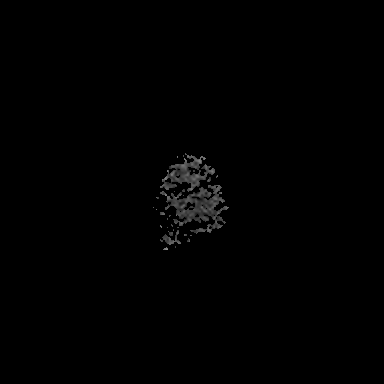

[Series 7: cor dwi_tracew · coronal · 5.0mm · 0.60mm/px · 5 of 38 slices shown]
[im 1/38]
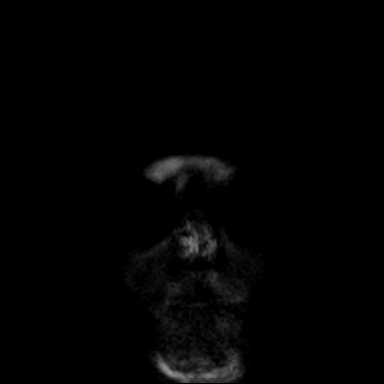
[im 10/38]
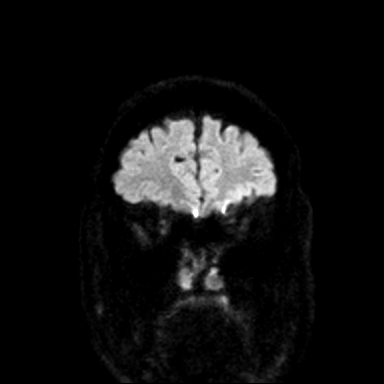
[im 19/38]
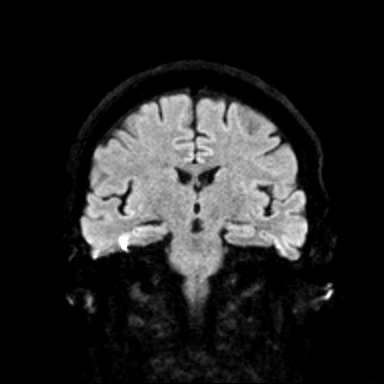
[im 28/38]
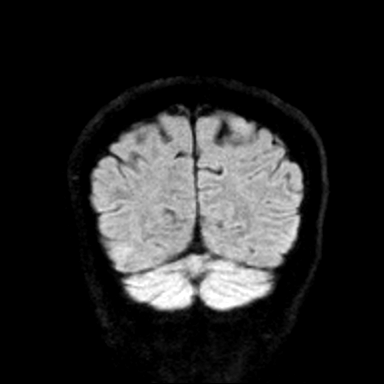
[im 38/38]
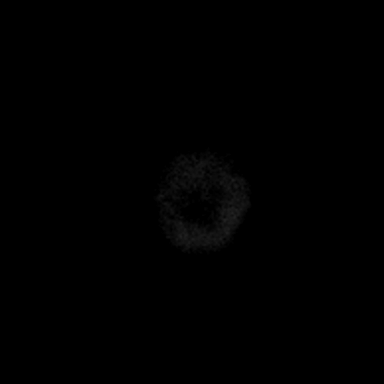

[Series 8: cor dwi_adc · coronal · 5.0mm · 0.60mm/px · 4 of 38 slices shown]
[im 1/38]
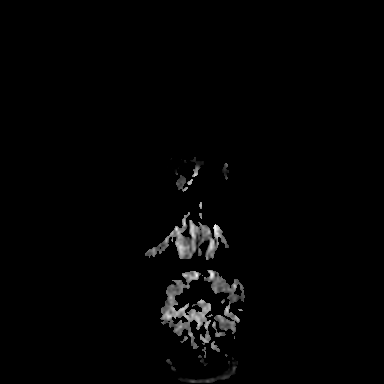
[im 10/38]
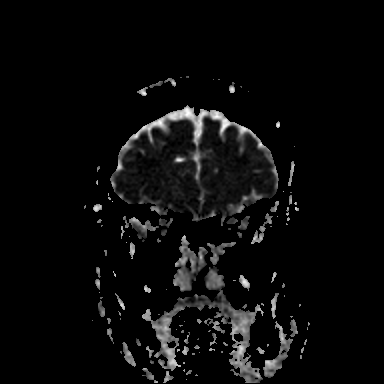
[im 19/38]
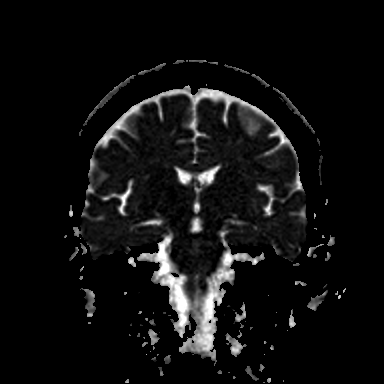
[im 28/38]
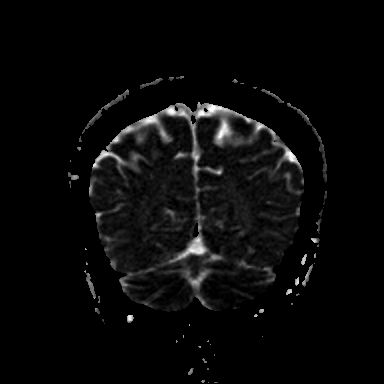

[Series 9: T1 · sagittal · 5.0mm · 0.62mm/px · 3 of 23 slices shown]
[im 1/23]
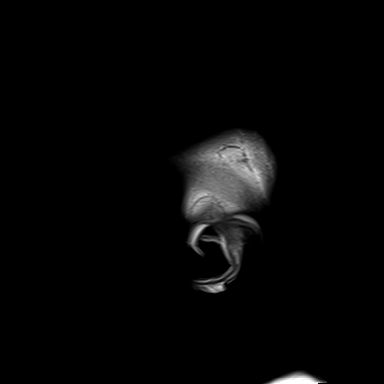
[im 12/23]
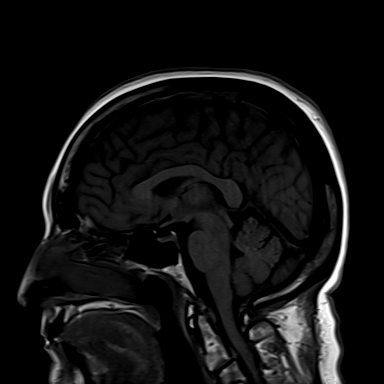
[im 23/23]
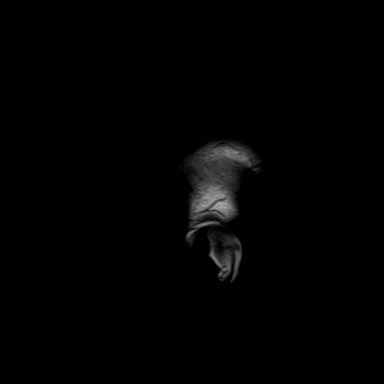

[Series 10: T2 · axial · 5.0mm · 0.53mm/px · z∈[-91,+51]mm · 3 of 25 slices shown]
[im 1/25]
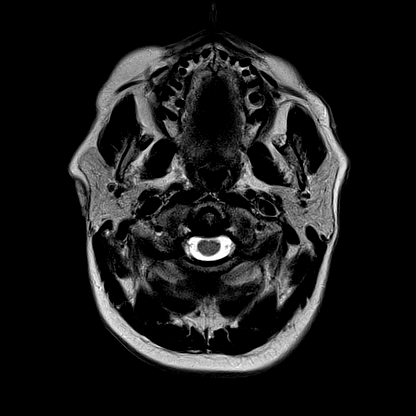
[im 13/25]
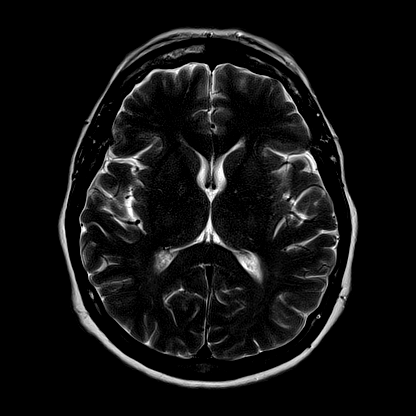
[im 25/25]
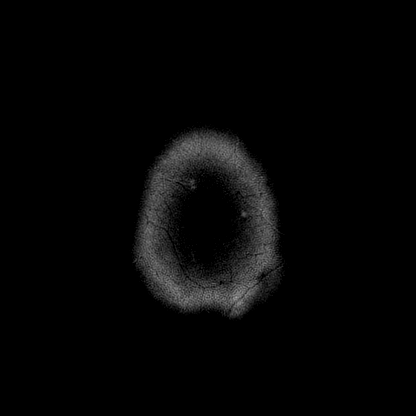

[Series 15: FLAIR · axial · 3.0mm · 0.53mm/px · z∈[-100,+60]mm · 7 of 55 slices shown]
[im 1/55]
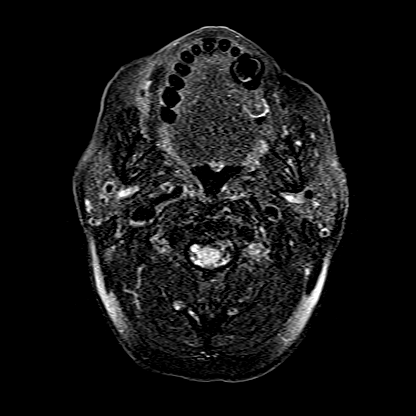
[im 10/55]
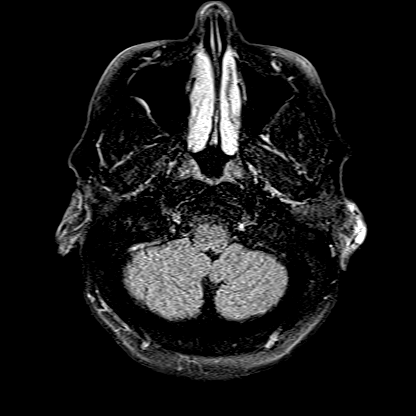
[im 19/55]
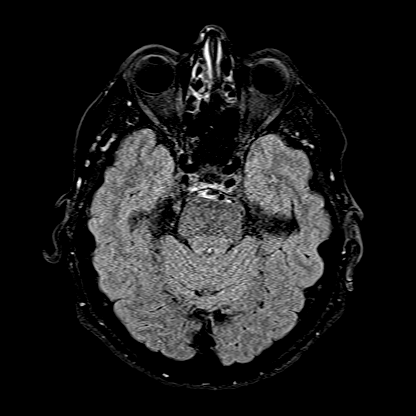
[im 28/55]
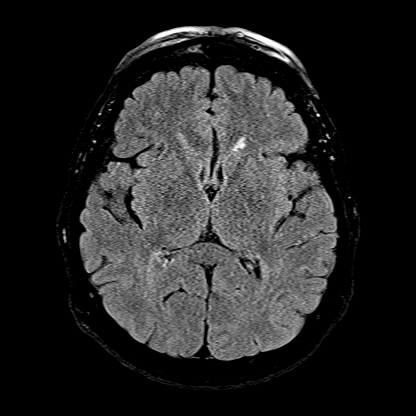
[im 37/55]
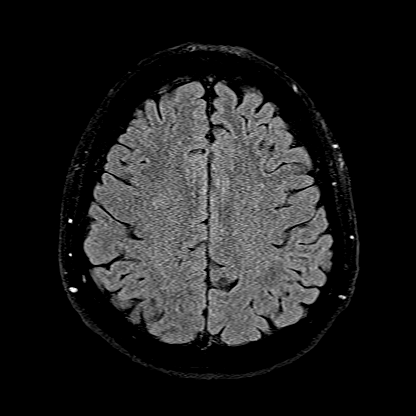
[im 46/55]
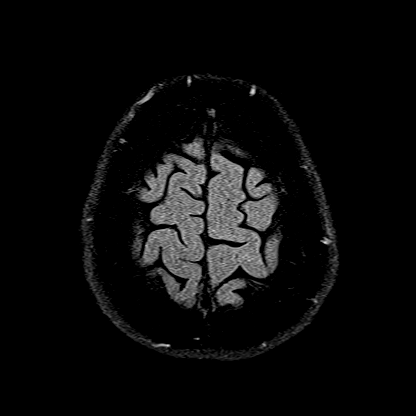
[im 55/55]
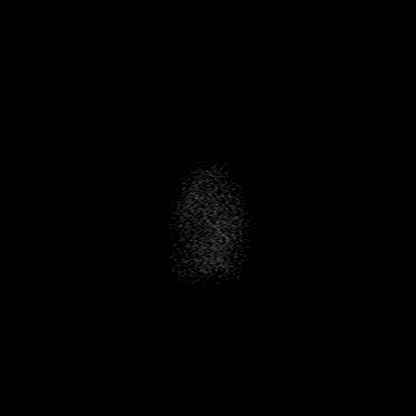

[33 of 48 positions shown; findings below may reference images not displayed]

FINDINGS: Brain: The brain has a normal appearance without evidence of
malformation, atrophy, old or acute small or large vessel
infarction, mass lesion, hemorrhage, hydrocephalus or extra-axial
collection.

Vascular: Major vessels at the base of the brain show flow. Venous
sinuses appear patent.

Skull and upper cervical spine: Normal.

Sinuses/Orbits: Clear/normal.

Other: None significant.
IMPRESSION: Normal examination. No cause of the presenting symptoms is
identified. Remarkable lack of small-vessel change or atrophy for a
patient of this age.

## 2019-03-03 DIAGNOSIS — R202 Paresthesia of skin: Secondary | ICD-10-CM | POA: Diagnosis not present

## 2019-03-03 DIAGNOSIS — R200 Anesthesia of skin: Secondary | ICD-10-CM | POA: Diagnosis not present

## 2019-03-04 DIAGNOSIS — R768 Other specified abnormal immunological findings in serum: Secondary | ICD-10-CM | POA: Diagnosis not present

## 2019-03-04 DIAGNOSIS — N184 Chronic kidney disease, stage 4 (severe): Secondary | ICD-10-CM | POA: Diagnosis not present

## 2019-03-04 DIAGNOSIS — M159 Polyosteoarthritis, unspecified: Secondary | ICD-10-CM | POA: Diagnosis not present

## 2019-03-08 DIAGNOSIS — R200 Anesthesia of skin: Secondary | ICD-10-CM | POA: Diagnosis not present

## 2019-03-08 DIAGNOSIS — R202 Paresthesia of skin: Secondary | ICD-10-CM | POA: Diagnosis not present

## 2019-04-08 DIAGNOSIS — R292 Abnormal reflex: Secondary | ICD-10-CM | POA: Diagnosis not present

## 2019-04-08 DIAGNOSIS — G609 Hereditary and idiopathic neuropathy, unspecified: Secondary | ICD-10-CM | POA: Diagnosis not present

## 2019-04-08 DIAGNOSIS — G5603 Carpal tunnel syndrome, bilateral upper limbs: Secondary | ICD-10-CM | POA: Diagnosis not present

## 2019-04-08 DIAGNOSIS — R258 Other abnormal involuntary movements: Secondary | ICD-10-CM | POA: Diagnosis not present

## 2019-04-08 DIAGNOSIS — E559 Vitamin D deficiency, unspecified: Secondary | ICD-10-CM | POA: Diagnosis not present

## 2019-05-17 DIAGNOSIS — G5603 Carpal tunnel syndrome, bilateral upper limbs: Secondary | ICD-10-CM | POA: Diagnosis not present

## 2019-05-24 DIAGNOSIS — R292 Abnormal reflex: Secondary | ICD-10-CM | POA: Diagnosis not present

## 2019-05-24 DIAGNOSIS — G5603 Carpal tunnel syndrome, bilateral upper limbs: Secondary | ICD-10-CM | POA: Diagnosis not present

## 2019-05-24 DIAGNOSIS — G609 Hereditary and idiopathic neuropathy, unspecified: Secondary | ICD-10-CM | POA: Diagnosis not present

## 2019-05-24 DIAGNOSIS — E559 Vitamin D deficiency, unspecified: Secondary | ICD-10-CM | POA: Diagnosis not present

## 2019-06-10 DIAGNOSIS — N184 Chronic kidney disease, stage 4 (severe): Secondary | ICD-10-CM | POA: Diagnosis not present

## 2019-06-10 DIAGNOSIS — I10 Essential (primary) hypertension: Secondary | ICD-10-CM | POA: Diagnosis not present

## 2019-06-10 DIAGNOSIS — E7800 Pure hypercholesterolemia, unspecified: Secondary | ICD-10-CM | POA: Diagnosis not present

## 2019-06-17 DIAGNOSIS — I129 Hypertensive chronic kidney disease with stage 1 through stage 4 chronic kidney disease, or unspecified chronic kidney disease: Secondary | ICD-10-CM | POA: Diagnosis not present

## 2019-06-17 DIAGNOSIS — N2581 Secondary hyperparathyroidism of renal origin: Secondary | ICD-10-CM | POA: Diagnosis not present

## 2019-06-17 DIAGNOSIS — E538 Deficiency of other specified B group vitamins: Secondary | ICD-10-CM | POA: Diagnosis not present

## 2019-06-17 DIAGNOSIS — E7800 Pure hypercholesterolemia, unspecified: Secondary | ICD-10-CM | POA: Diagnosis not present

## 2019-06-17 DIAGNOSIS — Z5320 Procedure and treatment not carried out because of patient's decision for unspecified reasons: Secondary | ICD-10-CM | POA: Diagnosis not present

## 2019-06-17 DIAGNOSIS — N184 Chronic kidney disease, stage 4 (severe): Secondary | ICD-10-CM | POA: Diagnosis not present

## 2019-06-17 DIAGNOSIS — F325 Major depressive disorder, single episode, in full remission: Secondary | ICD-10-CM | POA: Diagnosis not present

## 2019-07-05 ENCOUNTER — Ambulatory Visit: Payer: Medicare HMO | Attending: Internal Medicine

## 2019-07-05 DIAGNOSIS — Z23 Encounter for immunization: Secondary | ICD-10-CM | POA: Insufficient documentation

## 2019-07-05 NOTE — Progress Notes (Signed)
   Covid-19 Vaccination Clinic  Name:  Amy Vazquez    MRN: [id number] DOB: [birth year]  07/05/2019  [name] was observed post Covid-19 immunization for 15 minutes without incidence. She was provided with Vaccine Information Sheet and instruction to access the V-Safe system.   [name] was instructed to call 911 with any severe reactions post vaccine: Marland Kitchen Difficulty breathing  . Swelling of your face and throat  . A fast heartbeat  . A bad rash all over your body  . Dizziness and weakness    Immunizations Administered    Name Date Dose VIS Date Route   Pfizer COVID-19 Vaccine 07/05/2019  8:34 AM 0.3 mL 04/16/2019 Intramuscular   Manufacturer: [place]   Lot: [id number]   [place]: [id number]

## 2019-08-03 ENCOUNTER — Ambulatory Visit: Payer: Medicare HMO | Attending: Internal Medicine

## 2019-08-03 DIAGNOSIS — Z23 Encounter for immunization: Secondary | ICD-10-CM

## 2019-08-03 NOTE — Progress Notes (Signed)
   Covid-19 Vaccination Clinic  Name:  Amy Vazquez    MRN: [id number] DOB: 11/02/1945  08/03/2019  [name] was observed post Covid-19 immunization for 15 minutes without incident. She was provided with Vaccine Information Sheet and instruction to access the V-Safe system.   [name] was instructed to call 911 with any severe reactions post vaccine: Marland Kitchen Difficulty breathing  . Swelling of face and throat  . A fast heartbeat  . A bad rash all over body  . Dizziness and weakness   Immunizations Administered    Name Date Dose VIS Date Route   Pfizer COVID-19 Vaccine 08/03/2019  8:35 AM 0.3 mL 04/16/2019 Intramuscular   Manufacturer: [place]   Lot: [id number]   [place]: [id number]

## 2019-08-20 DIAGNOSIS — H35341 Macular cyst, hole, or pseudohole, right eye: Secondary | ICD-10-CM | POA: Diagnosis not present

## 2019-10-07 DIAGNOSIS — R790 Abnormal level of blood mineral: Secondary | ICD-10-CM | POA: Diagnosis not present

## 2019-10-07 DIAGNOSIS — E559 Vitamin D deficiency, unspecified: Secondary | ICD-10-CM | POA: Diagnosis not present

## 2019-10-07 DIAGNOSIS — G5603 Carpal tunnel syndrome, bilateral upper limbs: Secondary | ICD-10-CM | POA: Diagnosis not present

## 2019-10-07 DIAGNOSIS — E611 Iron deficiency: Secondary | ICD-10-CM | POA: Diagnosis not present

## 2019-10-07 DIAGNOSIS — E612 Magnesium deficiency: Secondary | ICD-10-CM | POA: Diagnosis not present

## 2019-10-07 DIAGNOSIS — G609 Hereditary and idiopathic neuropathy, unspecified: Secondary | ICD-10-CM | POA: Diagnosis not present

## 2019-10-07 DIAGNOSIS — R292 Abnormal reflex: Secondary | ICD-10-CM | POA: Diagnosis not present

## 2019-10-07 DIAGNOSIS — G2581 Restless legs syndrome: Secondary | ICD-10-CM | POA: Diagnosis not present

## 2019-12-08 DIAGNOSIS — E538 Deficiency of other specified B group vitamins: Secondary | ICD-10-CM | POA: Diagnosis not present

## 2019-12-08 DIAGNOSIS — N2581 Secondary hyperparathyroidism of renal origin: Secondary | ICD-10-CM | POA: Diagnosis not present

## 2019-12-08 DIAGNOSIS — N184 Chronic kidney disease, stage 4 (severe): Secondary | ICD-10-CM | POA: Diagnosis not present

## 2019-12-08 DIAGNOSIS — I10 Essential (primary) hypertension: Secondary | ICD-10-CM | POA: Diagnosis not present

## 2019-12-08 DIAGNOSIS — E7800 Pure hypercholesterolemia, unspecified: Secondary | ICD-10-CM | POA: Diagnosis not present

## 2019-12-09 DIAGNOSIS — G5603 Carpal tunnel syndrome, bilateral upper limbs: Secondary | ICD-10-CM | POA: Diagnosis not present

## 2019-12-09 DIAGNOSIS — R200 Anesthesia of skin: Secondary | ICD-10-CM | POA: Diagnosis not present

## 2019-12-09 DIAGNOSIS — E559 Vitamin D deficiency, unspecified: Secondary | ICD-10-CM | POA: Diagnosis not present

## 2019-12-09 DIAGNOSIS — G2581 Restless legs syndrome: Secondary | ICD-10-CM | POA: Diagnosis not present

## 2019-12-09 DIAGNOSIS — R202 Paresthesia of skin: Secondary | ICD-10-CM | POA: Diagnosis not present

## 2019-12-09 DIAGNOSIS — R292 Abnormal reflex: Secondary | ICD-10-CM | POA: Diagnosis not present

## 2019-12-15 DIAGNOSIS — N184 Chronic kidney disease, stage 4 (severe): Secondary | ICD-10-CM | POA: Diagnosis not present

## 2019-12-15 DIAGNOSIS — Z5320 Procedure and treatment not carried out because of patient's decision for unspecified reasons: Secondary | ICD-10-CM | POA: Diagnosis not present

## 2019-12-15 DIAGNOSIS — J4520 Mild intermittent asthma, uncomplicated: Secondary | ICD-10-CM | POA: Diagnosis not present

## 2019-12-15 DIAGNOSIS — I129 Hypertensive chronic kidney disease with stage 1 through stage 4 chronic kidney disease, or unspecified chronic kidney disease: Secondary | ICD-10-CM | POA: Diagnosis not present

## 2019-12-15 DIAGNOSIS — E7800 Pure hypercholesterolemia, unspecified: Secondary | ICD-10-CM | POA: Diagnosis not present

## 2019-12-15 DIAGNOSIS — F329 Major depressive disorder, single episode, unspecified: Secondary | ICD-10-CM | POA: Diagnosis not present

## 2019-12-15 DIAGNOSIS — Z0000 Encounter for general adult medical examination without abnormal findings: Secondary | ICD-10-CM | POA: Diagnosis not present

## 2019-12-28 DIAGNOSIS — M4802 Spinal stenosis, cervical region: Secondary | ICD-10-CM | POA: Diagnosis not present

## 2019-12-28 DIAGNOSIS — Z981 Arthrodesis status: Secondary | ICD-10-CM | POA: Diagnosis not present

## 2019-12-28 DIAGNOSIS — G959 Disease of spinal cord, unspecified: Secondary | ICD-10-CM | POA: Diagnosis not present

## 2019-12-28 DIAGNOSIS — M47812 Spondylosis without myelopathy or radiculopathy, cervical region: Secondary | ICD-10-CM | POA: Diagnosis not present

## 2019-12-28 DIAGNOSIS — M4312 Spondylolisthesis, cervical region: Secondary | ICD-10-CM | POA: Diagnosis not present

## 2020-01-03 DIAGNOSIS — R490 Dysphonia: Secondary | ICD-10-CM | POA: Diagnosis not present

## 2020-01-04 ENCOUNTER — Other Ambulatory Visit: Payer: Self-pay | Admitting: Neurosurgery

## 2020-01-14 ENCOUNTER — Other Ambulatory Visit: Payer: Self-pay

## 2020-01-14 ENCOUNTER — Encounter
Admission: RE | Admit: 2020-01-14 | Discharge: 2020-01-14 | Disposition: A | Discharge status: Home / Self Care | Payer: Medicare HMO | Source: Ambulatory Visit | Attending: Neurosurgery | Admitting: Neurosurgery

## 2020-01-14 DIAGNOSIS — M5000 Cervical disc disorder with myelopathy, unspecified cervical region: Secondary | ICD-10-CM | POA: Diagnosis not present

## 2020-01-14 DIAGNOSIS — R799 Abnormal finding of blood chemistry, unspecified: Secondary | ICD-10-CM | POA: Insufficient documentation

## 2020-01-14 DIAGNOSIS — I10 Essential (primary) hypertension: Secondary | ICD-10-CM | POA: Insufficient documentation

## 2020-01-14 DIAGNOSIS — Z01818 Encounter for other preprocedural examination: Secondary | ICD-10-CM | POA: Insufficient documentation

## 2020-01-14 HISTORY — DX: Anemia, unspecified: D64.9

## 2020-01-14 LAB — CBC
HCT: 33 % — ABNORMAL LOW (ref 36.0–46.0)
Hemoglobin: 12 g/dL (ref 12.0–15.0)
MCH: 30.9 pg (ref 26.0–34.0)
MCHC: 36.4 g/dL — ABNORMAL HIGH (ref 30.0–36.0)
MCV: 85.1 fL (ref 80.0–100.0)
Platelets: 289 10*3/uL (ref 150–400)
RBC: 3.88 MIL/uL (ref 3.87–5.11)
RDW: 13 % (ref 11.5–15.5)
WBC: 7 10*3/uL (ref 4.0–10.5)
nRBC: 0 % (ref 0.0–0.2)

## 2020-01-14 LAB — SURGICAL PCR SCREEN
MRSA, PCR: NEGATIVE
Staphylococcus aureus: POSITIVE — AB

## 2020-01-14 LAB — URINALYSIS, ROUTINE W REFLEX MICROSCOPIC
Bilirubin Urine: NEGATIVE
Glucose, UA: NEGATIVE mg/dL
Hgb urine dipstick: NEGATIVE
Ketones, ur: NEGATIVE mg/dL
Nitrite: NEGATIVE
Protein, ur: NEGATIVE mg/dL
Specific Gravity, Urine: 1.01 (ref 1.005–1.030)
pH: 6 (ref 5.0–8.0)

## 2020-01-14 LAB — BASIC METABOLIC PANEL
Anion gap: 8 (ref 5–15)
BUN: 38 mg/dL — ABNORMAL HIGH (ref 8–23)
CO2: 25 mmol/L (ref 22–32)
Calcium: 9.6 mg/dL (ref 8.9–10.3)
Chloride: 108 mmol/L (ref 98–111)
Creatinine, Ser: 1.81 mg/dL — ABNORMAL HIGH (ref 0.44–1.00)
GFR calc Af Amer: 32 mL/min — ABNORMAL LOW (ref >60)
GFR calc non Af Amer: 27 mL/min — ABNORMAL LOW (ref >60)
Glucose, Bld: 90 mg/dL (ref 70–99)
Potassium: 4.5 mmol/L (ref 3.5–5.1)
Sodium: 141 mmol/L (ref 135–145)

## 2020-01-14 LAB — TYPE AND SCREEN
ABO/RH(D): A POS
Antibody Screen: NEGATIVE

## 2020-01-14 LAB — PROTIME-INR
INR: 0.9 (ref 0.8–1.2)
Prothrombin Time: 11.4 seconds (ref 11.4–15.2)

## 2020-01-14 LAB — APTT: aPTT: 29 seconds (ref 24–36)

## 2020-01-14 NOTE — Patient Instructions (Addendum)
Your procedure is scheduled on: 01-24-20 MONDAY Report to Same Day Surgery 2nd floor medical mall [facility] Entrance-take elevator on left to 2nd floor.  Check in with surgery information desk.) To find out your arrival time please call [phone] between 1PM - 3PM on 01-21-20 FRIDAY  Remember: Instructions that are not followed completely may result in serious medical risk, up to and including death, or upon the discretion of your surgeon and anesthesiologist your surgery may need to be rescheduled.    _x___ 1. Do not eat food after midnight the night before your procedure. NO GUM OR CANDY AFTER MIDNIGHT. You may drink clear liquids up to 2 hours before you are scheduled to arrive at the hospital for your procedure.  Do not drink clear liquids within 2 hours of your scheduled arrival to the hospital.  Clear liquids include  --Water or Apple juice without pulp  -- Gatorade  --Black Coffee or Clear Tea (No milk, no creamers, do not add anything to the coffee or Tea     __x__ 2. No Alcohol for 24 hours before or after surgery.   __x__3. No Smoking or e-cigarettes for 24 prior to surgery.  Do not use any chewable tobacco products for at least 6 hour prior to surgery   ____  4. Bring all medications with you on the day of surgery if instructed.    __x__ 5. Notify your doctor if there is any change in your medical condition     (cold, fever, infections).    x___6. On the morning of surgery brush your teeth with toothpaste and water.  You may rinse your mouth with mouth wash if you wish.  Do not swallow any toothpaste or mouthwash.   Do not wear jewelry, make-up, hairpins, clips or nail polish.  Do not wear lotions, powders, or perfumes.   Do not shave 48 hours prior to surgery. Men may shave face and neck.  Do not bring valuables to the hospital.    [facility] is not responsible for any belongings or valuables.               Contacts, dentures or bridgework may not be worn into  surgery.  Leave your suitcase in the car. After surgery it may be brought to your room.  For patients admitted to the hospital, discharge time is determined by yourtreatment team.  _  Patients discharged the day of surgery will not be allowed to drive home.  You will need someone to drive you home and stay with you the night of your procedure.    Please read over the following fact sheets that you were given:   [facility] Preparing for Surgery and MRSA Information  _x___ TAKE THE FOLLOWING MEDICATION THE MORNING OF SURGERY WITH A SMALL SIP OF WATER. These include:  1. WELLBUTRIN (BUPROPION)  2. PRILOSEC (OMEPRAZOLE)  3. TAKE A PRILOSEC THE NIGHT BEFORE YOUR SURGERY  4.  5.  6.  ____Fleets enema or Magnesium Citrate as directed.   _x___ Use CHG Soap as directed on instruction sheet   _X___ Use inhalers on the day of surgery-USE YOUR [place]  ____ Stop Metformin and Janumet 2 days prior to surgery.    ____ Take 1/2 of usual insulin dose the night before surgery and none on the morning  surgery.   ____ Follow recommendations from Cardiologist, Pulmonologist or PCP regarding stopping Aspirin, Coumadin, Plavix ,Eliquis, Effient, or Pradaxa, and Pletal.  X____Stop Anti-inflammatories  such as Advil, Aleve, Ibuprofen, Motrin, Naproxen, Naprosyn, Goodies powders or aspirin products 7 DAYS PRIOR TO SURGERY-OK to take Tylenol    _x___ Stop supplements until after surgery-STOP YOUR TURMERIC AND VITAMIN C 7 DAYS PRIOR TO SURGERY-OK TO CONTINUE AFTER SURGERY   ____ Bring C-Pap to the hospital.

## 2020-01-14 NOTE — Progress Notes (Addendum)
  [facility] Perioperative Services: Pre-Admission/Anesthesia Testing  Abnormal Lab Notification   Date: 01/14/20  Name: Amy Vazquez MRN:   [id number]  Re: Abnormal labs noted during PAT appointment   Provider(s) Notified: Deetta Perla, MD Notification mode: Routed and/or faxed via [facility]   ABNORMAL LAB VALUE(S): Lab Results  Component Value Date   COLORURINE YELLOW (A) 01/14/2020   APPEARANCEUR HAZY (A) 01/14/2020   LABSPEC 1.010 01/14/2020   PHURINE 6.0 01/14/2020   GLUCOSEU NEGATIVE 01/14/2020   HGBUR NEGATIVE 01/14/2020   BILIRUBINUR NEGATIVE 01/14/2020   KETONESUR NEGATIVE 01/14/2020   PROTEINUR NEGATIVE 01/14/2020   NITRITE NEGATIVE 01/14/2020   LEUKOCYTESUR SMALL (A) 01/14/2020   EPIU 0-5 01/14/2020   WBCU 11-20 01/14/2020   RBCU 0-5 01/14/2020   BACTERIA MANY (A) 01/14/2020   Lab Results  Component Value Date   STAPHAUREUS POSITIVE (A) 01/14/2020   MRSAPCR NEGATIVE 01/14/2020   Lab Results  Component Value Date   BUN 38 (H) 01/14/2020   CREATININE 1.81 (H) 01/14/2020  Estimated Creatinine Clearance: 26.8 mL/min (A) (by C-G formula based on SCr of 1.81 mg/dL (H)).  Notes:  Scheduled for a multi-level ACDF on 01/24/2020. UA concerning for infection. Urine C&S added to assess for pathogenically significant growth. UA, and subsequent C&S results, will be forwarded to attending surgeon. This is a Community education officer; no formal response is required.  ADDENDUM: 01/17/2020 at 0810 - C&S reviewed as being (+) for > 100,000 CFU/mL of Proteus mirabilis. Communication from surgeon indicates that patient was started on nitrofurantoin on 01/14/2020. In review of the sensitivity report today, results suggest resistance to this medication. Spoke with Genevie Cheshire, RN at Dr. Jonathon Jordan office to make them aware. Reminded RN to make sure surgeon is aware that patient has a reduced CrCl of 26.8 mL/min when considering change in antimicrobial therapy.   Honor Loh, MSN,  APRN, FNP-C, CEN [facility]  Peri-operative Services Nurse Practitioner Phone: [phone] 01/14/20 1:19 PM

## 2020-01-16 LAB — URINE CULTURE: Culture: >=100000 — AB

## 2020-01-20 ENCOUNTER — Other Ambulatory Visit
Admission: RE | Admit: 2020-01-20 | Discharge: 2020-01-20 | Disposition: A | Discharge status: Home / Self Care | Payer: Medicare HMO | Source: Ambulatory Visit | Attending: Neurosurgery | Admitting: Neurosurgery

## 2020-01-20 ENCOUNTER — Other Ambulatory Visit: Payer: Self-pay

## 2020-01-20 DIAGNOSIS — Z20822 Contact with and (suspected) exposure to covid-19: Secondary | ICD-10-CM | POA: Insufficient documentation

## 2020-01-20 DIAGNOSIS — Z01812 Encounter for preprocedural laboratory examination: Secondary | ICD-10-CM | POA: Insufficient documentation

## 2020-01-21 LAB — SARS CORONAVIRUS 2 (TAT 6-24 HRS): SARS Coronavirus 2: NEGATIVE

## 2020-01-24 ENCOUNTER — Observation Stay
Admission: RE | Admit: 2020-01-24 | Discharge: 2020-01-25 | Disposition: A | Discharge status: Home / Self Care | Payer: Medicare HMO | Attending: Neurosurgery | Admitting: Neurosurgery

## 2020-01-24 ENCOUNTER — Observation Stay: Payer: Medicare HMO

## 2020-01-24 ENCOUNTER — Encounter: Payer: Self-pay | Admitting: Neurosurgery

## 2020-01-24 ENCOUNTER — Ambulatory Visit: Payer: Medicare HMO | Admitting: Urgent Care

## 2020-01-24 ENCOUNTER — Other Ambulatory Visit: Payer: Self-pay

## 2020-01-24 ENCOUNTER — Encounter
Admission: RE | Disposition: A | Discharge status: Home / Self Care | Payer: Self-pay | Source: Home / Self Care | Attending: Neurosurgery

## 2020-01-24 ENCOUNTER — Ambulatory Visit: Payer: Medicare HMO

## 2020-01-24 ENCOUNTER — Ambulatory Visit: Payer: Medicare HMO | Admitting: Registered Nurse

## 2020-01-24 DIAGNOSIS — E785 Hyperlipidemia, unspecified: Secondary | ICD-10-CM | POA: Diagnosis not present

## 2020-01-24 DIAGNOSIS — Z87891 Personal history of nicotine dependence: Secondary | ICD-10-CM | POA: Insufficient documentation

## 2020-01-24 DIAGNOSIS — Z79899 Other long term (current) drug therapy: Secondary | ICD-10-CM | POA: Diagnosis not present

## 2020-01-24 DIAGNOSIS — Z419 Encounter for procedure for purposes other than remedying health state, unspecified: Secondary | ICD-10-CM

## 2020-01-24 DIAGNOSIS — G959 Disease of spinal cord, unspecified: Secondary | ICD-10-CM | POA: Diagnosis not present

## 2020-01-24 DIAGNOSIS — J45909 Unspecified asthma, uncomplicated: Secondary | ICD-10-CM | POA: Diagnosis not present

## 2020-01-24 DIAGNOSIS — N183 Chronic kidney disease, stage 3 unspecified: Secondary | ICD-10-CM | POA: Insufficient documentation

## 2020-01-24 DIAGNOSIS — M5001 Cervical disc disorder with myelopathy,  high cervical region: Secondary | ICD-10-CM | POA: Diagnosis not present

## 2020-01-24 DIAGNOSIS — M4322 Fusion of spine, cervical region: Secondary | ICD-10-CM | POA: Diagnosis not present

## 2020-01-24 DIAGNOSIS — M47812 Spondylosis without myelopathy or radiculopathy, cervical region: Secondary | ICD-10-CM | POA: Diagnosis not present

## 2020-01-24 DIAGNOSIS — M5020 Other cervical disc displacement, unspecified cervical region: Secondary | ICD-10-CM | POA: Diagnosis not present

## 2020-01-24 DIAGNOSIS — Z981 Arthrodesis status: Secondary | ICD-10-CM

## 2020-01-24 DIAGNOSIS — I129 Hypertensive chronic kidney disease with stage 1 through stage 4 chronic kidney disease, or unspecified chronic kidney disease: Secondary | ICD-10-CM | POA: Insufficient documentation

## 2020-01-24 DIAGNOSIS — R202 Paresthesia of skin: Secondary | ICD-10-CM | POA: Diagnosis present

## 2020-01-24 HISTORY — PX: ANTERIOR CERVICAL DECOMP/DISCECTOMY FUSION: SHX1161

## 2020-01-24 LAB — CREATININE, SERUM
Creatinine, Ser: 2.08 mg/dL — ABNORMAL HIGH (ref 0.44–1.00)
GFR calc Af Amer: 27 mL/min — ABNORMAL LOW (ref >60)
GFR calc non Af Amer: 23 mL/min — ABNORMAL LOW (ref >60)

## 2020-01-24 LAB — ABO/RH: ABO/RH(D): A POS

## 2020-01-24 IMAGING — RF DG CERVICAL SPINE 2 OR 3 VIEWS
1 series · 2 of 2 positions shown · non-contrast
Comparison: Cervical MRI [DATE]

FLUOROSCOPY TIME:  0 minutes 10 seconds; 2 acquired images

CLINICAL DATA: Anterior fusion

EXAM:
CERVICAL SPINE - 2-3 VIEW; DG C-ARM 1-60 MIN

[Series 1: dg x-ray · 0.20mm/px · 2 of 2 slices shown]
[im 1/2]
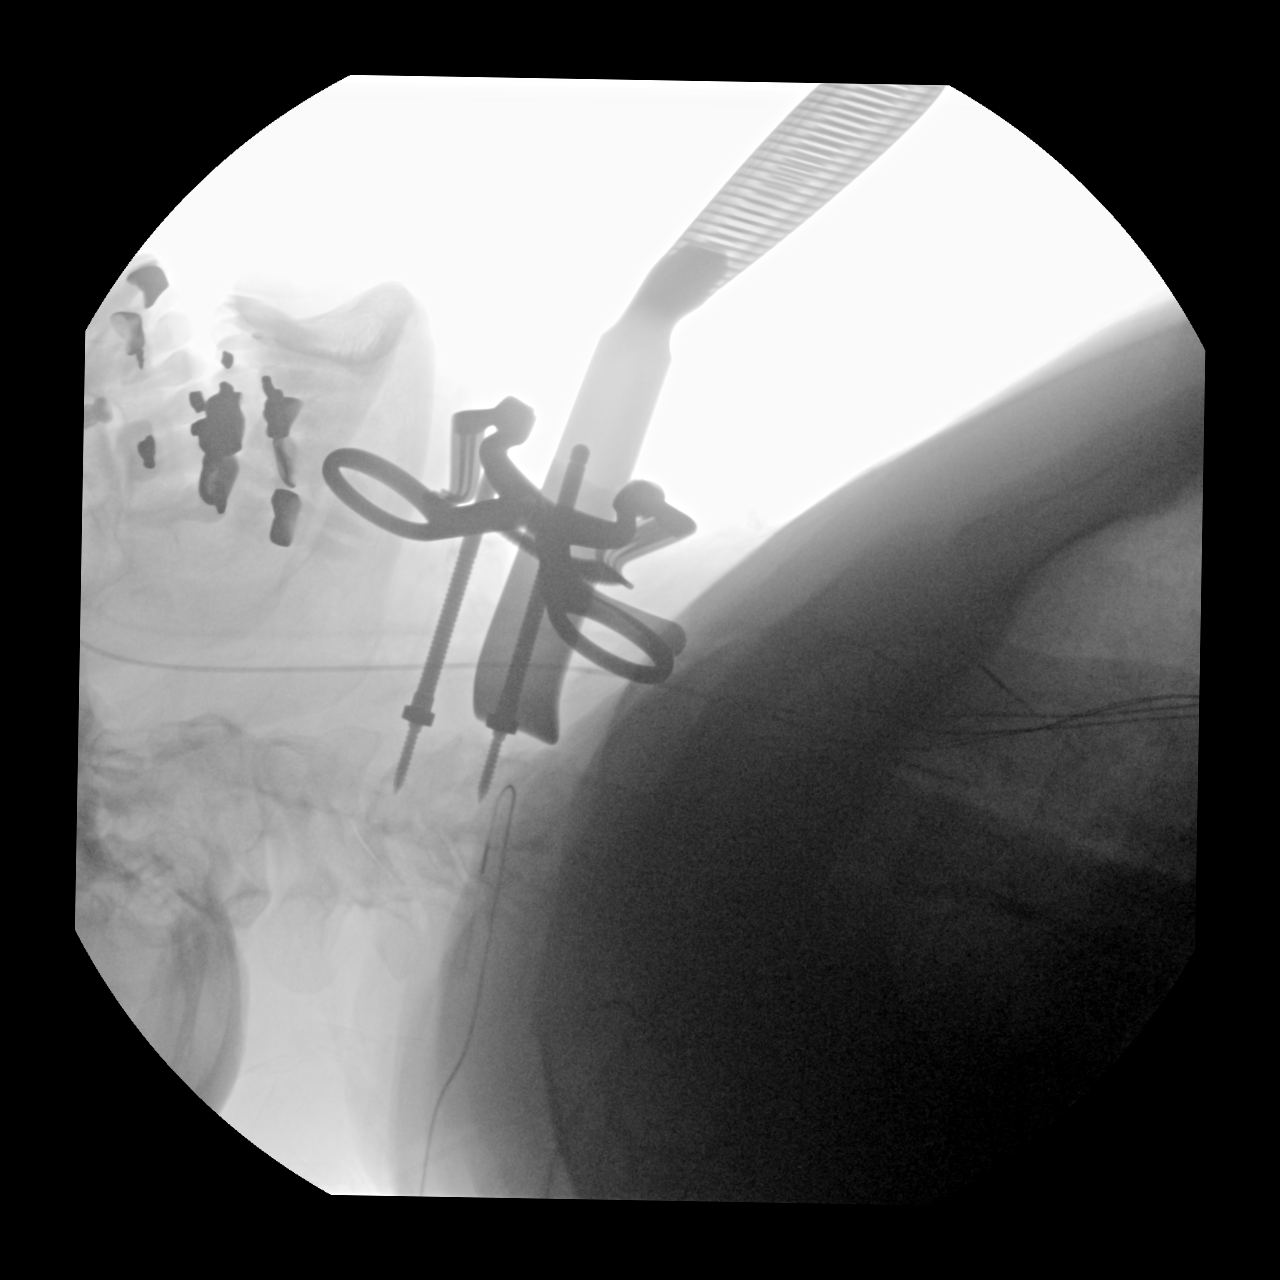
[im 2/2]
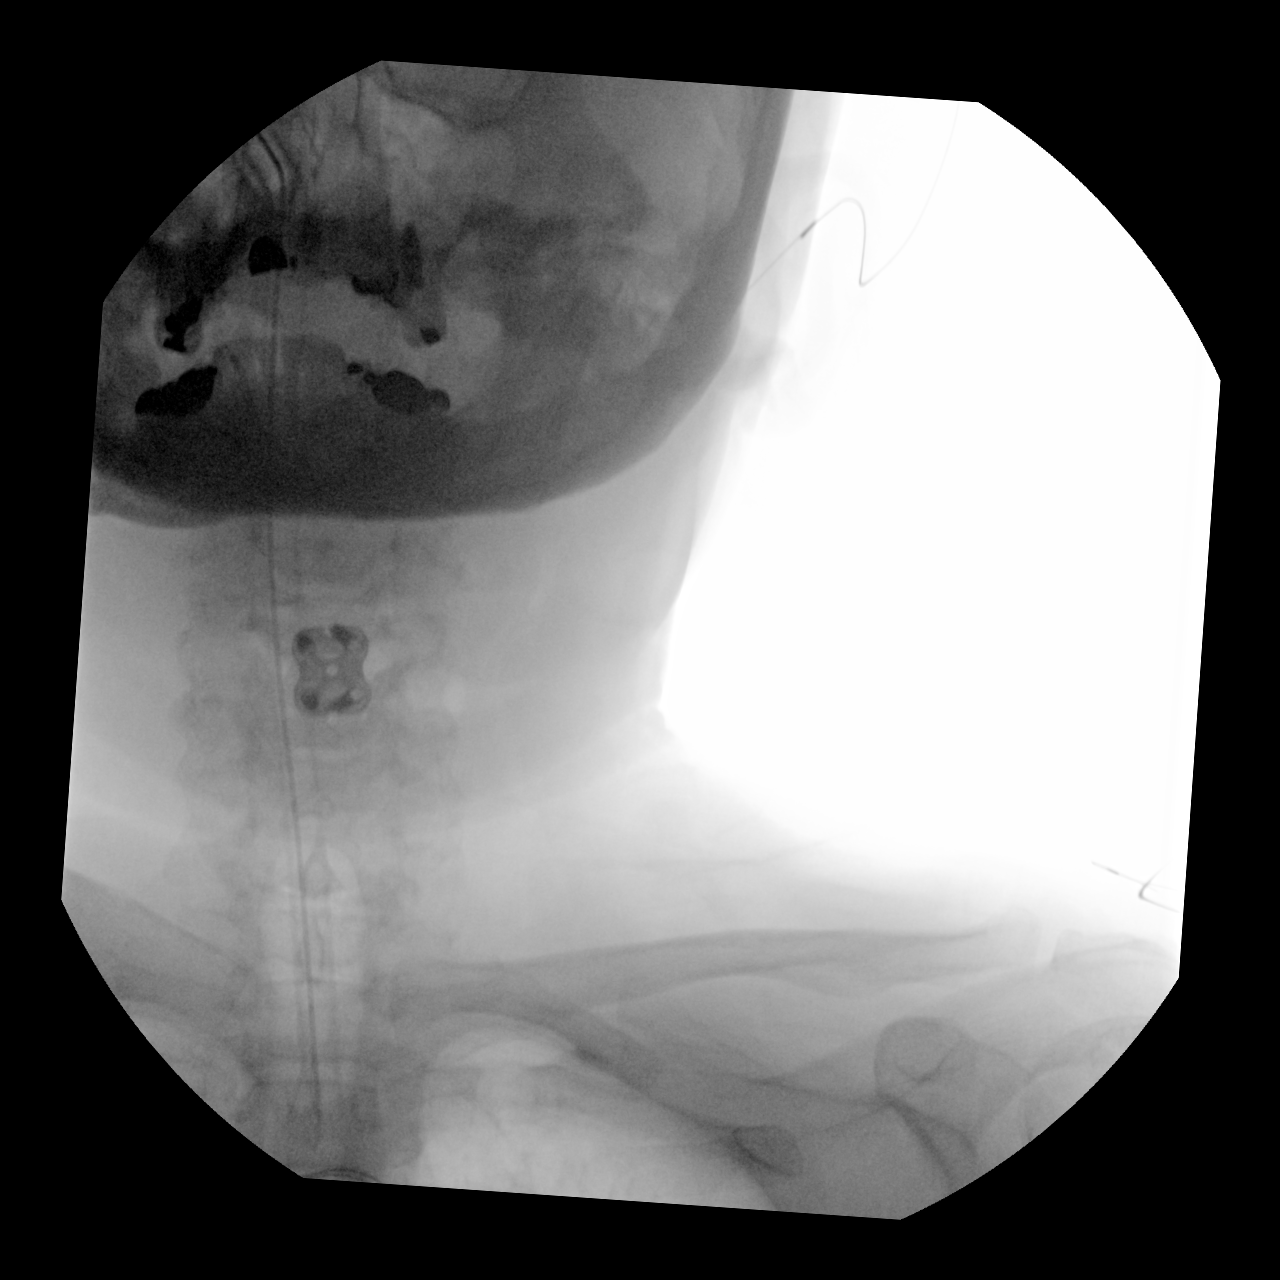

[2 of 2 positions shown; findings below may reference images not displayed]

FINDINGS: Initial cross-table lateral images show metallic screws overlying
the C3 and C4 vertebral bodies. Subsequently, a frontal image shows
anterior screw and plate fixation at C3 and C4 with support hardware
intact on frontal view. No fracture or spondylolisthesis evident.
IMPRESSION: Screw and plate fixation at C3 and C4 with support hardware intact.
No fracture or spondylolisthesis.

## 2020-01-24 IMAGING — RF DG C-ARM 1-60 MIN
1 series · 2 of 2 positions shown · non-contrast
Comparison: Cervical MRI [DATE]

FLUOROSCOPY TIME:  0 minutes 10 seconds; 2 acquired images

CLINICAL DATA: Anterior fusion

EXAM:
CERVICAL SPINE - 2-3 VIEW; DG C-ARM 1-60 MIN

[Series 1: dg x-ray · 0.20mm/px · 2 of 2 slices shown]
[im 1/2]
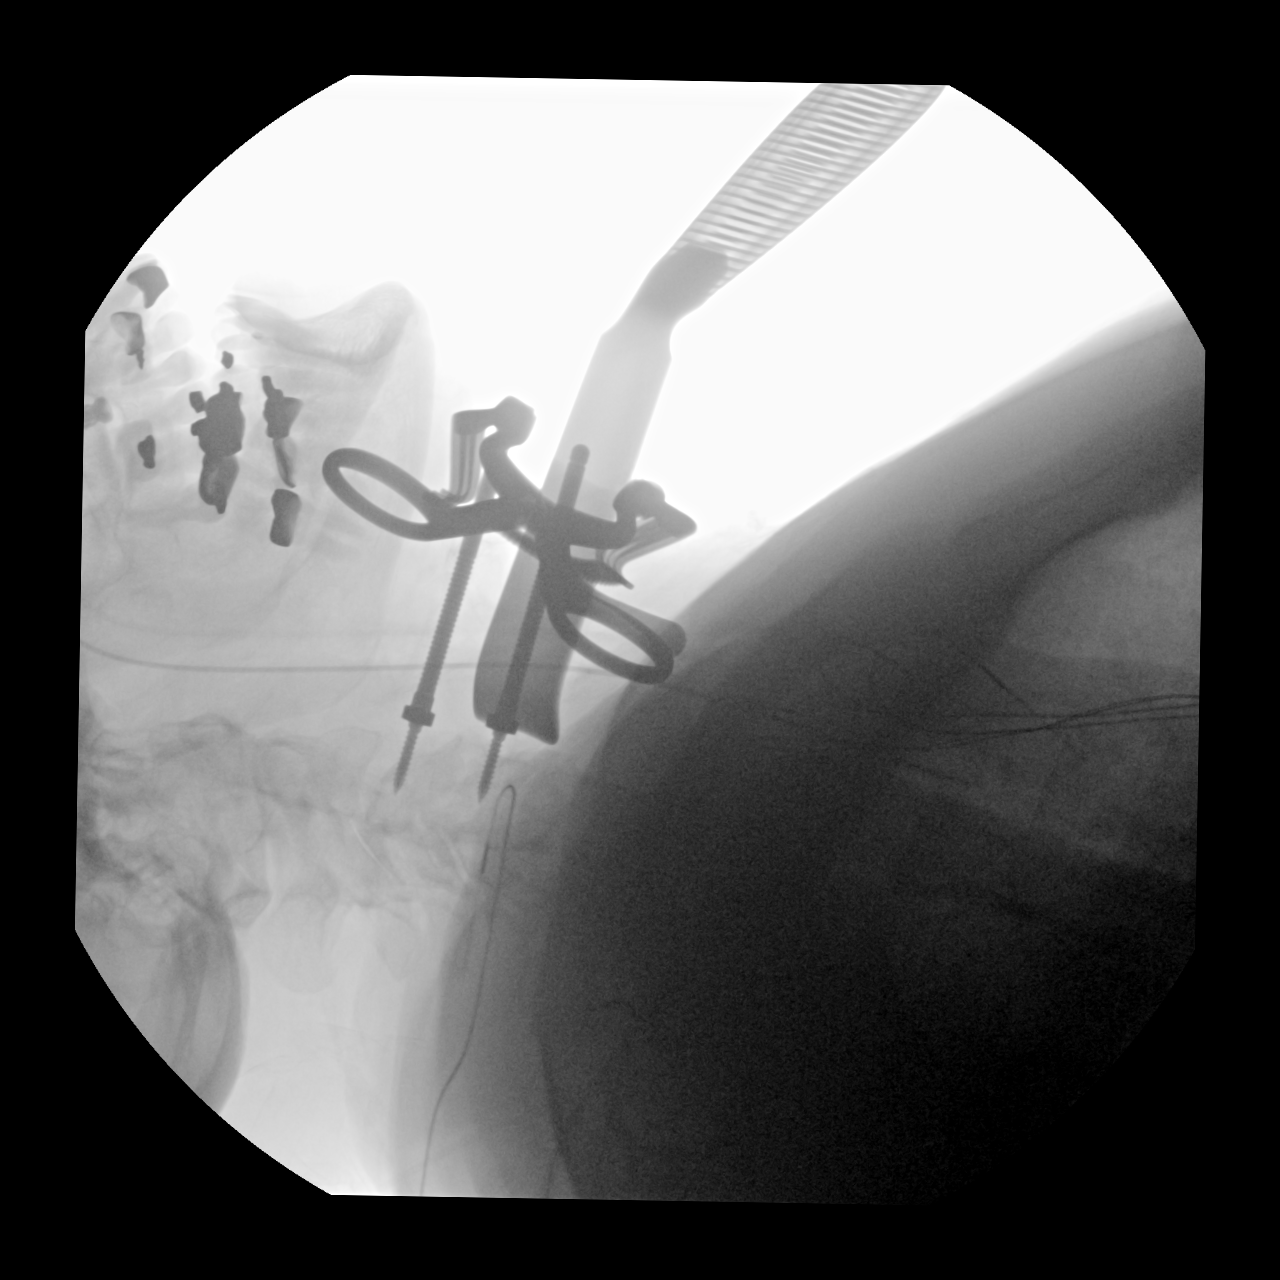
[im 2/2]
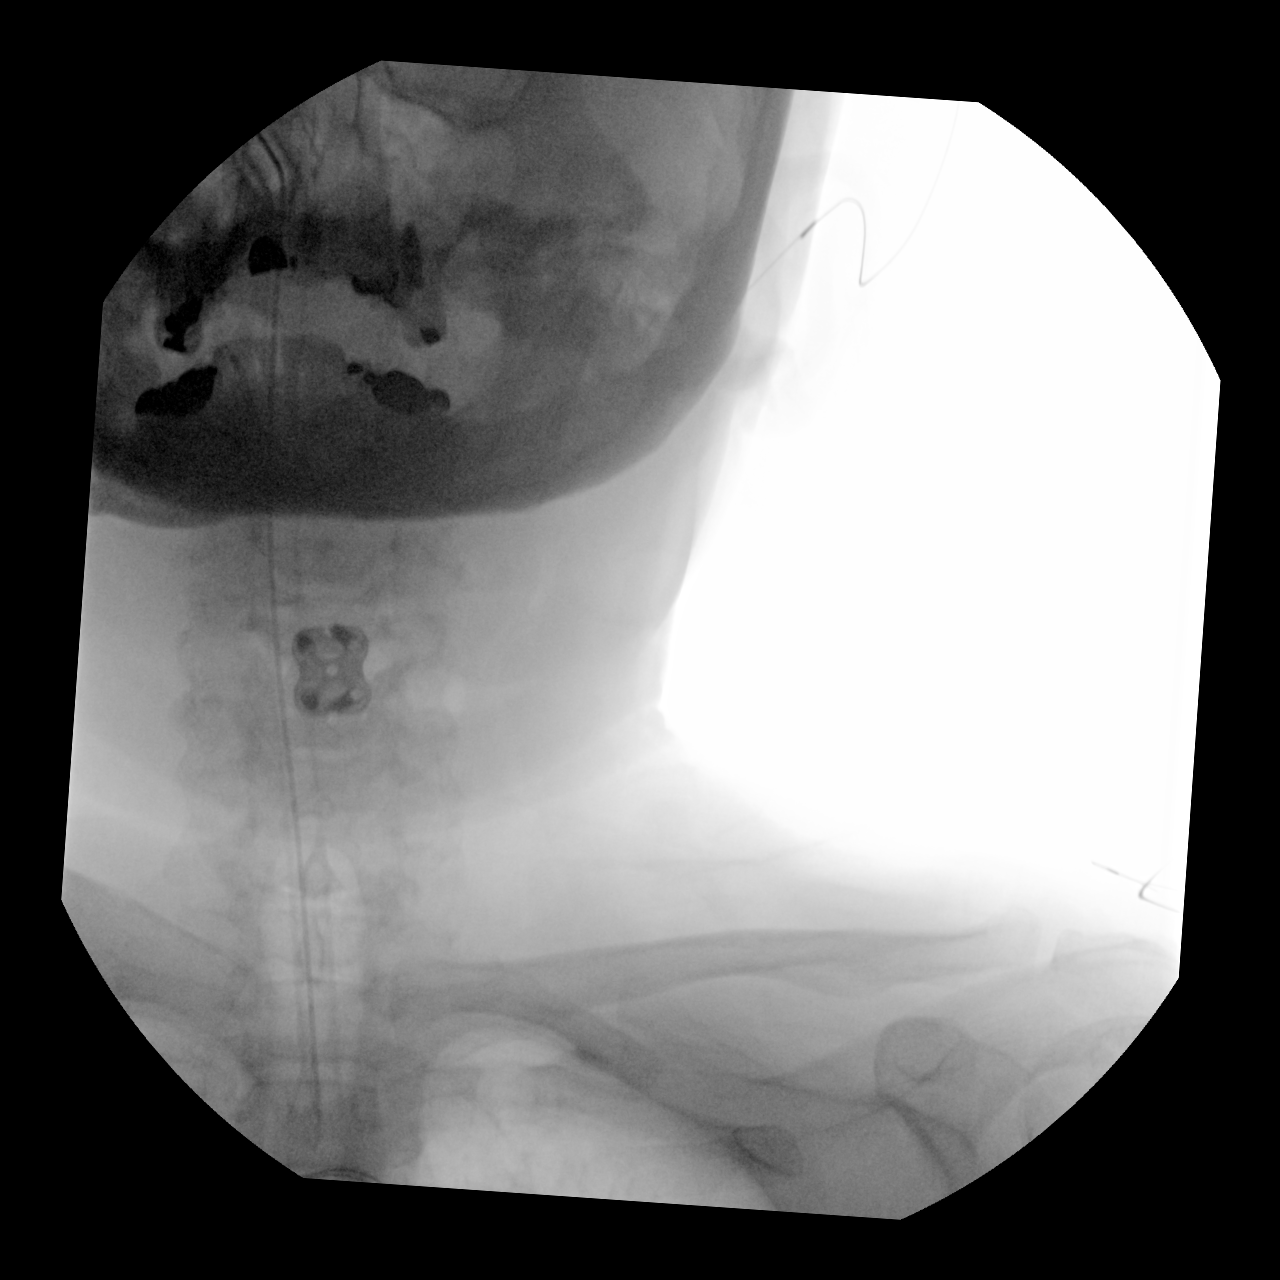

[2 of 2 positions shown; findings below may reference images not displayed]

FINDINGS: Initial cross-table lateral images show metallic screws overlying
the C3 and C4 vertebral bodies. Subsequently, a frontal image shows
anterior screw and plate fixation at C3 and C4 with support hardware
intact on frontal view. No fracture or spondylolisthesis evident.
IMPRESSION: Screw and plate fixation at C3 and C4 with support hardware intact.
No fracture or spondylolisthesis.

## 2020-01-24 IMAGING — CR DG CERVICAL SPINE 2 OR 3 VIEWS
2 series · 2 of 2 positions shown · non-contrast
Comparison: [DATE]

CLINICAL DATA: Status post fusion.

EXAM:
CERVICAL SPINE - 2-3 VIEW

[c-spine lat]
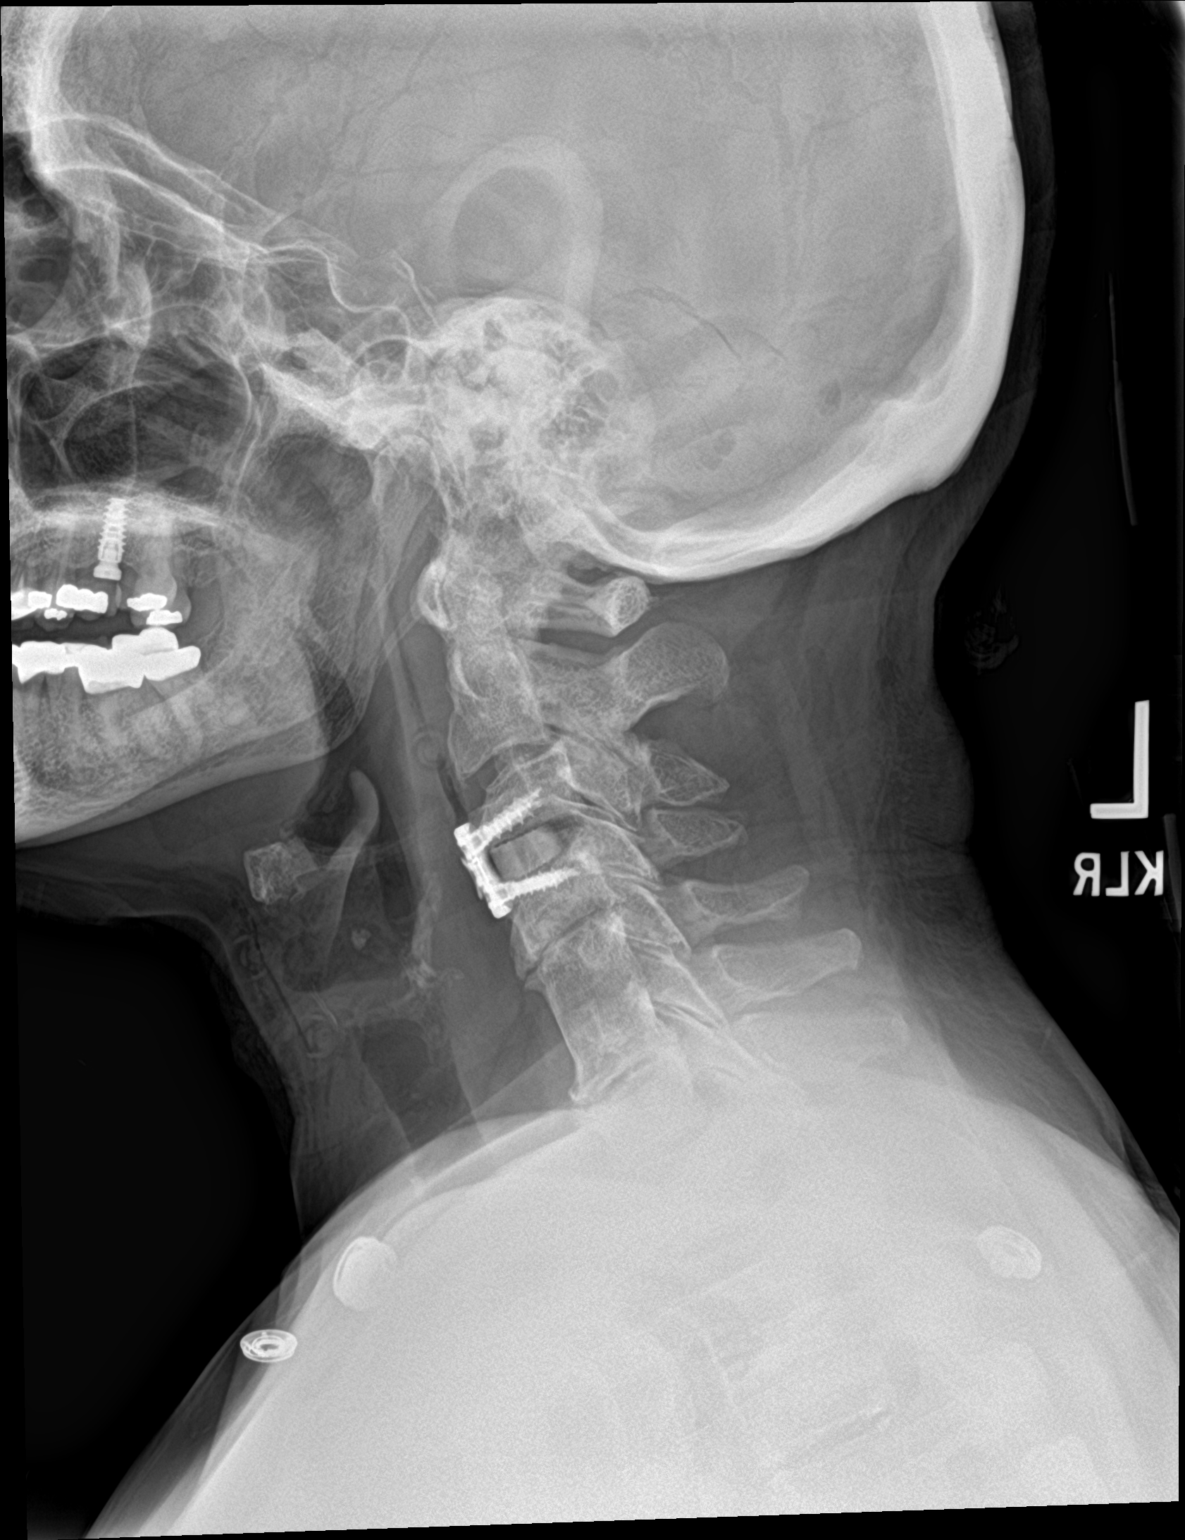

[c-spine ap]
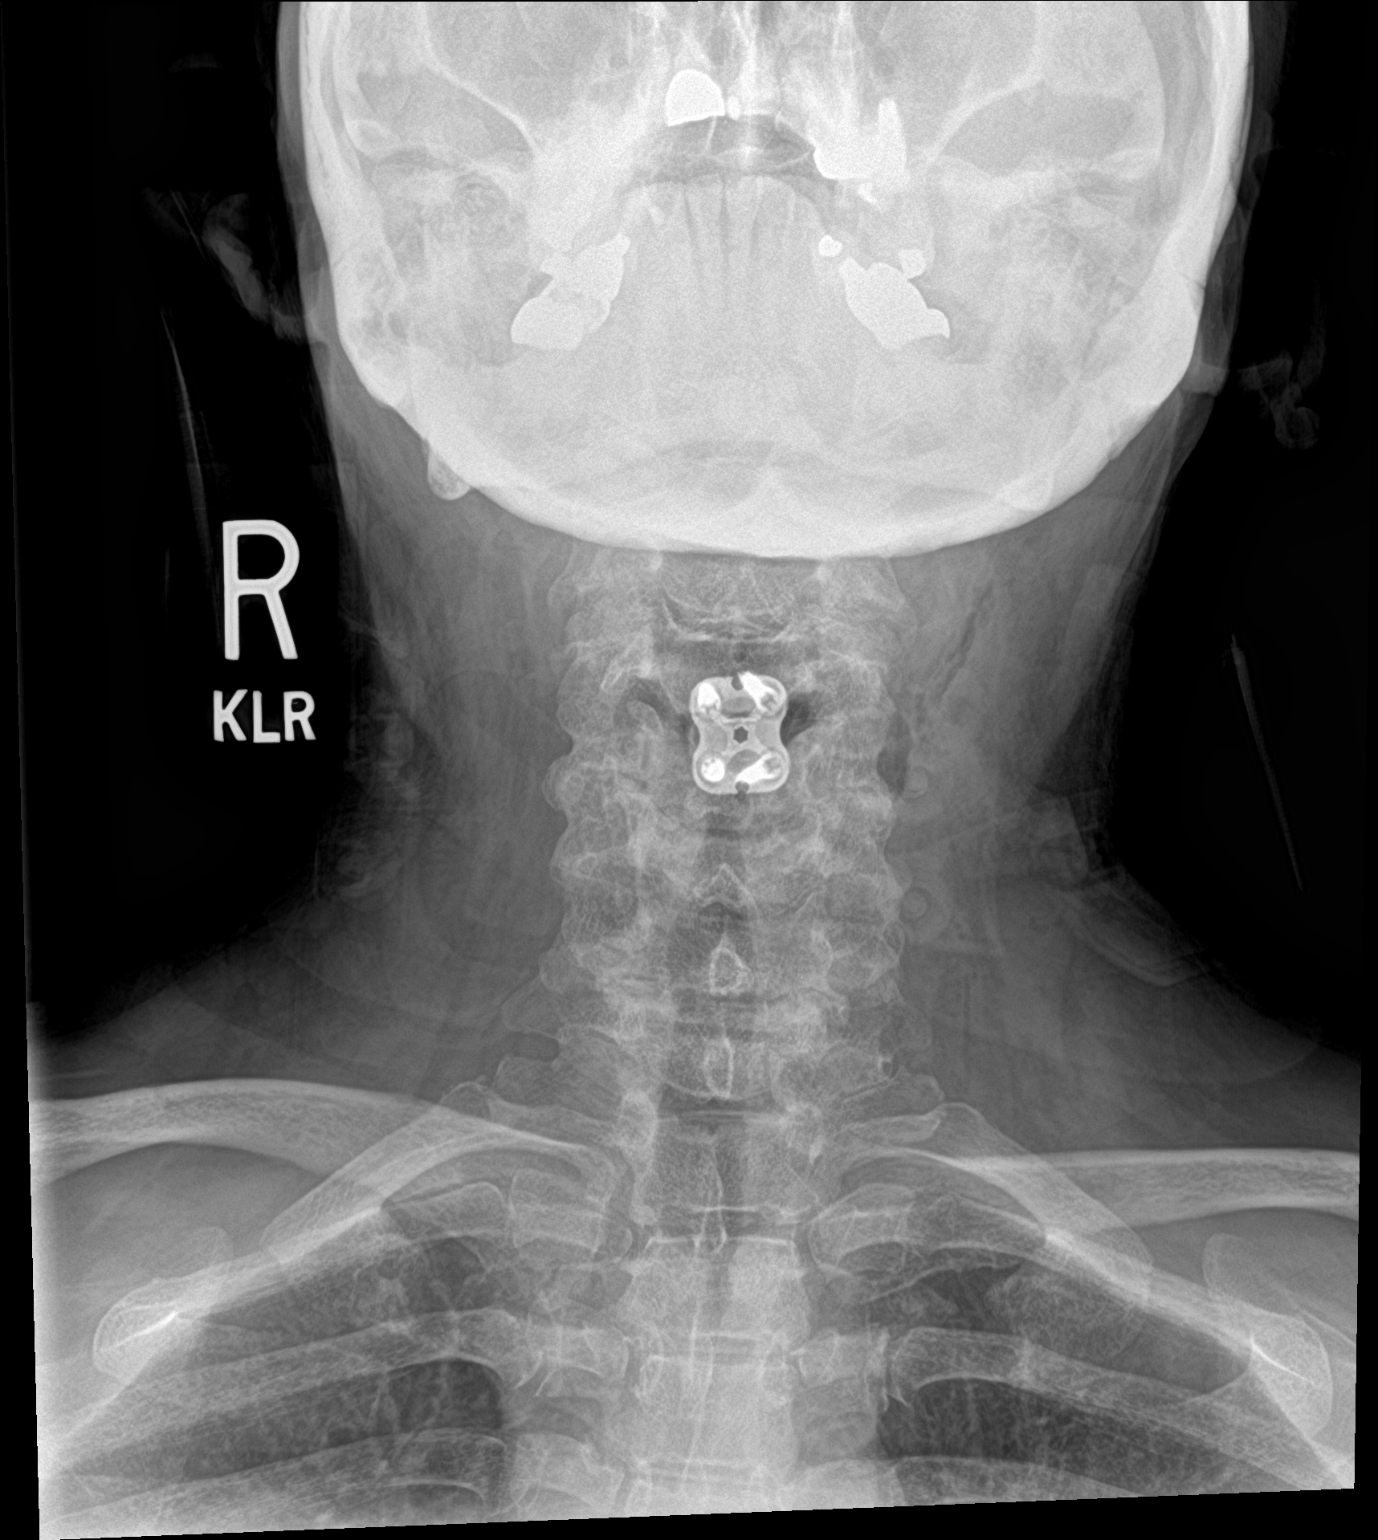

[2 of 2 positions shown; findings below may reference images not displayed]

FINDINGS: The patient has undergone ACDF from C3 through C4. The hardware
appears intact. There is interbody spacer. There are expected
postsurgical changes. There appears to be fusion of the C5 and C6
vertebral bodies. Severe degenerative changes are noted at the C2-C3
and C4-C5 levels.
IMPRESSION: Status post ACDF from C3 through C4. No evidence for hardware
failure or loosening.

## 2020-01-24 SURGERY — ANTERIOR CERVICAL DECOMPRESSION/DISCECTOMY FUSION 2 LEVELS
Anesthesia: General

## 2020-01-24 MED ORDER — LIDOCAINE-EPINEPHRINE 1 %-1:100000 IJ SOLN
INTRAMUSCULAR | Status: AC
  Filled 2020-01-24: qty 1

## 2020-01-24 MED ORDER — DEXMEDETOMIDINE (PRECEDEX) IN NS 20 MCG/5ML (4 MCG/ML) IV SYRINGE
PREFILLED_SYRINGE | INTRAVENOUS | Status: AC
  Filled 2020-01-24: qty 5

## 2020-01-24 MED ORDER — THROMBIN 5000 UNITS EX SOLR
CUTANEOUS | Status: AC
  Filled 2020-01-24: qty 5000

## 2020-01-24 MED ORDER — SODIUM CHLORIDE 0.9 % IV SOLN
INTRAVENOUS | Status: DC | PRN
  Administered 2020-01-24: 50 ug/min via INTRAVENOUS

## 2020-01-24 MED ORDER — ALUM & MAG HYDROXIDE-SIMETH 200-200-20 MG/5ML PO SUSP: 30.0000 mL | Freq: Four times a day (QID) | ORAL | Status: DC | PRN

## 2020-01-24 MED ORDER — PROPOFOL 500 MG/50ML IV EMUL
INTRAVENOUS | Status: AC
  Filled 2020-01-24: qty 50

## 2020-01-24 MED ORDER — MOMETASONE FURO-FORMOTEROL FUM 200-5 MCG/ACT IN AERO
2.0000 | INHALATION_SPRAY | Freq: Two times a day (BID) | RESPIRATORY_TRACT | Status: DC
  Administered 2020-01-24 – 2020-01-25 (×2): 2 via RESPIRATORY_TRACT
  Filled 2020-01-24: qty 8.8

## 2020-01-24 MED ORDER — HYDROMORPHONE HCL 1 MG/ML IJ SOLN: 0.5000 mg | INTRAMUSCULAR | Status: DC | PRN

## 2020-01-24 MED ORDER — THROMBIN 5000 UNITS EX SOLR
CUTANEOUS | Status: DC | PRN
  Administered 2020-01-24 (×2): 5000 [IU] via TOPICAL

## 2020-01-24 MED ORDER — DEXAMETHASONE SODIUM PHOSPHATE 10 MG/ML IJ SOLN
INTRAMUSCULAR | Status: DC | PRN
  Administered 2020-01-24: 10 mg via INTRAVENOUS

## 2020-01-24 MED ORDER — CHLORHEXIDINE GLUCONATE 0.12 % MT SOLN
OROMUCOSAL | Status: AC
  Administered 2020-01-24: 15 mL via OROMUCOSAL
  Filled 2020-01-24: qty 15

## 2020-01-24 MED ORDER — GELATIN ABSORBABLE 12-7 MM EX MISC
CUTANEOUS | Status: AC
  Filled 2020-01-24: qty 1

## 2020-01-24 MED ORDER — BUPROPION HCL ER (SR) 150 MG PO TB12
150.0000 mg | ORAL_TABLET | Freq: Two times a day (BID) | ORAL | Status: DC
  Administered 2020-01-24 – 2020-01-25 (×2): 150 mg via ORAL
  Filled 2020-01-24 (×3): qty 1

## 2020-01-24 MED ORDER — ONDANSETRON HCL 4 MG PO TABS: 4.0000 mg | ORAL_TABLET | Freq: Four times a day (QID) | ORAL | Status: DC | PRN

## 2020-01-24 MED ORDER — METHOCARBAMOL 500 MG PO TABS
750.0000 mg | ORAL_TABLET | Freq: Four times a day (QID) | ORAL | Status: DC
  Administered 2020-01-24 – 2020-01-25 (×3): 750 mg via ORAL
  Filled 2020-01-24 (×3): qty 2

## 2020-01-24 MED ORDER — SODIUM CHLORIDE 0.9% FLUSH: 3.0000 mL | INTRAVENOUS | Status: DC | PRN

## 2020-01-24 MED ORDER — BISACODYL 5 MG PO TBEC: 5.0000 mg | DELAYED_RELEASE_TABLET | Freq: Every day | ORAL | Status: DC | PRN

## 2020-01-24 MED ORDER — FENTANYL CITRATE (PF) 100 MCG/2ML IJ SOLN
INTRAMUSCULAR | Status: AC
  Administered 2020-01-24: 25 ug via INTRAVENOUS
  Filled 2020-01-24: qty 2

## 2020-01-24 MED ORDER — FLEET ENEMA 7-19 GM/118ML RE ENEM: 1.0000 | ENEMA | Freq: Once | RECTAL | Status: DC | PRN

## 2020-01-24 MED ORDER — DOCUSATE SODIUM 100 MG PO CAPS
100.0000 mg | ORAL_CAPSULE | Freq: Two times a day (BID) | ORAL | Status: DC
  Administered 2020-01-24 – 2020-01-25 (×2): 100 mg via ORAL
  Filled 2020-01-24 (×2): qty 1

## 2020-01-24 MED ORDER — REMIFENTANIL HCL 1 MG IV SOLR
INTRAVENOUS | Status: DC | PRN
  Administered 2020-01-24: .15 ug/kg/min via INTRAVENOUS

## 2020-01-24 MED ORDER — SUCCINYLCHOLINE CHLORIDE 200 MG/10ML IV SOSY
PREFILLED_SYRINGE | INTRAVENOUS | Status: AC
  Filled 2020-01-24: qty 10

## 2020-01-24 MED ORDER — LIDOCAINE HCL (PF) 2 % IJ SOLN
INTRAMUSCULAR | Status: AC
  Filled 2020-01-24: qty 5

## 2020-01-24 MED ORDER — REMIFENTANIL HCL 1 MG IV SOLR
INTRAVENOUS | Status: AC
  Filled 2020-01-24: qty 1000

## 2020-01-24 MED ORDER — OXYCODONE HCL 5 MG PO TABS: 10.0000 mg | ORAL_TABLET | ORAL | Status: DC | PRN

## 2020-01-24 MED ORDER — ACETAMINOPHEN 500 MG PO TABS
1000.0000 mg | ORAL_TABLET | Freq: Three times a day (TID) | ORAL | Status: DC
  Administered 2020-01-24 – 2020-01-25 (×3): 1000 mg via ORAL
  Filled 2020-01-24 (×3): qty 2

## 2020-01-24 MED ORDER — OXYCODONE HCL 5 MG PO TABS
5.0000 mg | ORAL_TABLET | ORAL | Status: DC | PRN
  Administered 2020-01-24 – 2020-01-25 (×2): 5 mg via ORAL
  Filled 2020-01-24 (×2): qty 1

## 2020-01-24 MED ORDER — LISINOPRIL 20 MG PO TABS
20.0000 mg | ORAL_TABLET | Freq: Every day | ORAL | Status: DC
  Administered 2020-01-24 – 2020-01-25 (×2): 20 mg via ORAL
  Filled 2020-01-24 (×2): qty 1

## 2020-01-24 MED ORDER — POLYETHYLENE GLYCOL 3350 17 G PO PACK: 17.0000 g | PACK | Freq: Every day | ORAL | Status: DC | PRN

## 2020-01-24 MED ORDER — HYDROMORPHONE HCL 1 MG/ML IJ SOLN
INTRAMUSCULAR | Status: DC | PRN
  Administered 2020-01-24: .2 mg via INTRAVENOUS
  Administered 2020-01-24: .5 mg via INTRAVENOUS

## 2020-01-24 MED ORDER — PHENOL 1.4 % MT LIQD
1.0000 | OROMUCOSAL | Status: DC | PRN
  Filled 2020-01-24: qty 177

## 2020-01-24 MED ORDER — LISINOPRIL-HYDROCHLOROTHIAZIDE 20-25 MG PO TABS: 1.0000 | ORAL_TABLET | Freq: Every evening | ORAL | Status: DC

## 2020-01-24 MED ORDER — EPHEDRINE 5 MG/ML INJ
INTRAVENOUS | Status: AC
  Filled 2020-01-24: qty 10

## 2020-01-24 MED ORDER — HYDROCHLOROTHIAZIDE 25 MG PO TABS
25.0000 mg | ORAL_TABLET | Freq: Every day | ORAL | Status: DC
  Administered 2020-01-24: 25 mg via ORAL
  Filled 2020-01-24: qty 1

## 2020-01-24 MED ORDER — SODIUM CHLORIDE 0.9 % IV SOLN
50.0000 mL/h | INTRAVENOUS | Status: DC
  Administered 2020-01-24: 50 mL/h via INTRAVENOUS

## 2020-01-24 MED ORDER — SUCCINYLCHOLINE CHLORIDE 20 MG/ML IJ SOLN
INTRAMUSCULAR | Status: DC | PRN
  Administered 2020-01-24: 120 mg via INTRAVENOUS
  Administered 2020-01-24: 60 mg via INTRAVENOUS

## 2020-01-24 MED ORDER — METHOCARBAMOL 1000 MG/10ML IJ SOLN: 500.0000 mg | Freq: Four times a day (QID) | INTRAVENOUS | Status: DC

## 2020-01-24 MED ORDER — SERTRALINE HCL 50 MG PO TABS
100.0000 mg | ORAL_TABLET | Freq: Every evening | ORAL | Status: DC
  Administered 2020-01-24: 100 mg via ORAL
  Filled 2020-01-24: qty 2

## 2020-01-24 MED ORDER — FENTANYL CITRATE (PF) 100 MCG/2ML IJ SOLN
25.0000 ug | INTRAMUSCULAR | Status: DC | PRN
  Administered 2020-01-24 (×2): 25 ug via INTRAVENOUS

## 2020-01-24 MED ORDER — ORAL CARE MOUTH RINSE: 15.0000 mL | Freq: Once | OROMUCOSAL | Status: AC

## 2020-01-24 MED ORDER — PROPOFOL 10 MG/ML IV BOLUS
INTRAVENOUS | Status: DC | PRN
  Administered 2020-01-24: 30 mg via INTRAVENOUS
  Administered 2020-01-24: 40 mg via INTRAVENOUS
  Administered 2020-01-24: 140 mg via INTRAVENOUS
  Administered 2020-01-24: 60 mg via INTRAVENOUS

## 2020-01-24 MED ORDER — FENTANYL CITRATE (PF) 100 MCG/2ML IJ SOLN
INTRAMUSCULAR | Status: DC | PRN
  Administered 2020-01-24 (×2): 50 ug via INTRAVENOUS

## 2020-01-24 MED ORDER — LIDOCAINE-EPINEPHRINE 1 %-1:100000 IJ SOLN
INTRAMUSCULAR | Status: DC | PRN
  Administered 2020-01-24: 4 mL

## 2020-01-24 MED ORDER — HYDROMORPHONE HCL 1 MG/ML IJ SOLN
INTRAMUSCULAR | Status: AC
Start: 2020-01-24 — End: ?
  Filled 2020-01-24: qty 1

## 2020-01-24 MED ORDER — ONDANSETRON HCL 4 MG/2ML IJ SOLN: 4.0000 mg | Freq: Once | INTRAMUSCULAR | Status: DC | PRN

## 2020-01-24 MED ORDER — ROCURONIUM BROMIDE 10 MG/ML (PF) SYRINGE
PREFILLED_SYRINGE | INTRAVENOUS | Status: AC
  Filled 2020-01-24: qty 10

## 2020-01-24 MED ORDER — ENOXAPARIN SODIUM 30 MG/0.3ML ~~LOC~~ SOLN: 30.0000 mg | SUBCUTANEOUS | Status: DC

## 2020-01-24 MED ORDER — CEFAZOLIN SODIUM-DEXTROSE 2-4 GM/100ML-% IV SOLN
2.0000 g | Freq: Once | INTRAVENOUS | Status: AC
  Administered 2020-01-24: 2 g via INTRAVENOUS

## 2020-01-24 MED ORDER — ONDANSETRON HCL 4 MG/2ML IJ SOLN
INTRAMUSCULAR | Status: AC
  Filled 2020-01-24: qty 2

## 2020-01-24 MED ORDER — LACTATED RINGERS IV SOLN: INTRAVENOUS | Status: DC

## 2020-01-24 MED ORDER — ONDANSETRON HCL 4 MG/2ML IJ SOLN
INTRAMUSCULAR | Status: DC | PRN
  Administered 2020-01-24: 4 mg via INTRAVENOUS

## 2020-01-24 MED ORDER — GELATIN ABSORBABLE 12-7 MM EX MISC
CUTANEOUS | Status: DC | PRN
  Administered 2020-01-24: 1 via TOPICAL

## 2020-01-24 MED ORDER — KETAMINE HCL 10 MG/ML IJ SOLN
INTRAMUSCULAR | Status: DC | PRN
  Administered 2020-01-24: 20 mg via INTRAVENOUS
  Administered 2020-01-24: 30 mg via INTRAVENOUS
  Administered 2020-01-24 (×3): 10 mg via INTRAVENOUS

## 2020-01-24 MED ORDER — CEFAZOLIN SODIUM-DEXTROSE 2-4 GM/100ML-% IV SOLN
INTRAVENOUS | Status: AC
  Filled 2020-01-24: qty 100

## 2020-01-24 MED ORDER — VASOPRESSIN 20 UNIT/ML IV SOLN
INTRAVENOUS | Status: AC
  Filled 2020-01-24: qty 1

## 2020-01-24 MED ORDER — SENNA 8.6 MG PO TABS
2.0000 | ORAL_TABLET | Freq: Two times a day (BID) | ORAL | Status: DC
  Administered 2020-01-24 – 2020-01-25 (×2): 17.2 mg via ORAL
  Filled 2020-01-24 (×2): qty 2

## 2020-01-24 MED ORDER — MENTHOL 3 MG MT LOZG
1.0000 | LOZENGE | OROMUCOSAL | Status: DC | PRN
  Filled 2020-01-24: qty 9

## 2020-01-24 MED ORDER — OXYMETAZOLINE HCL 0.05 % NA SOLN
NASAL | Status: AC
  Filled 2020-01-24: qty 30

## 2020-01-24 MED ORDER — PROPOFOL 500 MG/50ML IV EMUL
INTRAVENOUS | Status: DC | PRN
  Administered 2020-01-24: 125 ug/kg/min via INTRAVENOUS

## 2020-01-24 MED ORDER — GABAPENTIN 100 MG PO CAPS
100.0000 mg | ORAL_CAPSULE | Freq: Three times a day (TID) | ORAL | Status: DC
  Administered 2020-01-24 – 2020-01-25 (×3): 100 mg via ORAL
  Filled 2020-01-24 (×3): qty 1

## 2020-01-24 MED ORDER — LIDOCAINE HCL (CARDIAC) PF 100 MG/5ML IV SOSY
PREFILLED_SYRINGE | INTRAVENOUS | Status: DC | PRN
  Administered 2020-01-24: 50 mg via INTRAVENOUS
  Administered 2020-01-24: 60 mg via INTRAVENOUS
  Administered 2020-01-24: 40 mg via INTRAVENOUS

## 2020-01-24 MED ORDER — CHLORHEXIDINE GLUCONATE 0.12 % MT SOLN: 15.0000 mL | Freq: Once | OROMUCOSAL | Status: AC

## 2020-01-24 MED ORDER — PHENYLEPHRINE HCL (PRESSORS) 10 MG/ML IV SOLN
INTRAVENOUS | Status: DC | PRN
  Administered 2020-01-24 (×2): 200 ug via INTRAVENOUS
  Administered 2020-01-24: 100 ug via INTRAVENOUS

## 2020-01-24 MED ORDER — VERAPAMIL HCL ER 180 MG PO TBCR
180.0000 mg | EXTENDED_RELEASE_TABLET | Freq: Every day | ORAL | Status: DC
  Filled 2020-01-24 (×2): qty 1

## 2020-01-24 MED ORDER — ACETAMINOPHEN 10 MG/ML IV SOLN
INTRAVENOUS | Status: AC
  Filled 2020-01-24: qty 100

## 2020-01-24 MED ORDER — ACETAMINOPHEN 10 MG/ML IV SOLN
INTRAVENOUS | Status: DC | PRN
  Administered 2020-01-24: 1000 mg via INTRAVENOUS

## 2020-01-24 MED ORDER — EPHEDRINE SULFATE 50 MG/ML IJ SOLN
INTRAMUSCULAR | Status: DC | PRN
  Administered 2020-01-24 (×2): 10 mg via INTRAVENOUS
  Administered 2020-01-24 (×2): 5 mg via INTRAVENOUS

## 2020-01-24 MED ORDER — DEXMEDETOMIDINE HCL 200 MCG/2ML IV SOLN
INTRAVENOUS | Status: DC | PRN
  Administered 2020-01-24: 12 ug via INTRAVENOUS
  Administered 2020-01-24: 8 ug via INTRAVENOUS

## 2020-01-24 MED ORDER — ONDANSETRON HCL 4 MG/2ML IJ SOLN: 4.0000 mg | Freq: Four times a day (QID) | INTRAMUSCULAR | Status: DC | PRN

## 2020-01-24 MED ORDER — PRAVASTATIN SODIUM 20 MG PO TABS
40.0000 mg | ORAL_TABLET | Freq: Every day | ORAL | Status: DC
  Administered 2020-01-24: 40 mg via ORAL
  Filled 2020-01-24: qty 2

## 2020-01-24 MED ORDER — SODIUM CHLORIDE 0.9% FLUSH
3.0000 mL | Freq: Two times a day (BID) | INTRAVENOUS | Status: DC
  Administered 2020-01-25 (×2): 3 mL via INTRAVENOUS

## 2020-01-24 MED ORDER — DEXAMETHASONE SODIUM PHOSPHATE 10 MG/ML IJ SOLN
INTRAMUSCULAR | Status: AC
  Filled 2020-01-24: qty 1

## 2020-01-24 SURGICAL SUPPLY — 61 items
BIT DRILL 13 (BIT) ×2 IMPLANT
BIT DRILL 13MM (BIT) ×1
BLADE BOVIE TIP EXT 4 (BLADE) ×3 IMPLANT
BLADE SURG 15 STRL LF DISP TIS (BLADE) ×1 IMPLANT
BLADE SURG 15 STRL SS (BLADE) ×2
BONE WEDGE CONERSTONE 6X14X11 (Bone Implant) ×3 IMPLANT
BUR DIAMOND COARSE 4.0 RND (BURR) ×3 IMPLANT
BUR NEURO DRILL SOFT 3.0X3.8M (BURR) ×3 IMPLANT
CANISTER SUCT 1200ML W/VALVE (MISCELLANEOUS) ×3 IMPLANT
CHLORAPREP W/TINT 26 (MISCELLANEOUS) ×6 IMPLANT
COUNTER NEEDLE 20/40 LG (NEEDLE) ×3 IMPLANT
COVER LIGHT HANDLE STERIS (MISCELLANEOUS) ×6 IMPLANT
COVER WAND RF STERILE (DRAPES) ×3 IMPLANT
CUP MEDICINE 2OZ PLAST GRAD ST (MISCELLANEOUS) ×6 IMPLANT
DERMABOND ADVANCED (GAUZE/BANDAGES/DRESSINGS) ×2
DERMABOND ADVANCED .7 DNX12 (GAUZE/BANDAGES/DRESSINGS) ×1 IMPLANT
DRAPE C-ARM 42X72 X-RAY (DRAPES) ×6 IMPLANT
DRAPE MICROSCOPE SPINE 48X150 (DRAPES) ×3 IMPLANT
DRAPE SURG 17X11 SM STRL (DRAPES) ×6 IMPLANT
DRAPE THYROID T SHEET (DRAPES) ×3 IMPLANT
ELECT CAUTERY BLADE TIP 2.5 (TIP) ×3
ELECT EZSTD 165MM 6.5IN (MISCELLANEOUS) ×3
ELECTRODE CAUTERY BLDE TIP 2.5 (TIP) ×1 IMPLANT
ELECTRODE EZSTD 165MM 6.5IN (MISCELLANEOUS) ×1 IMPLANT
FEE INTRAOP MONITOR IMPULS NCS (MISCELLANEOUS) ×1 IMPLANT
GAUZE SPONGE 4X4 12PLY STRL (GAUZE/BANDAGES/DRESSINGS) ×3 IMPLANT
GLOVE BIOGEL PI IND STRL 8 (GLOVE) ×1 IMPLANT
GLOVE BIOGEL PI INDICATOR 8 (GLOVE) ×2
GLOVE INDICATOR 7.0 STRL GRN (GLOVE) ×3 IMPLANT
GLOVE SURG SYN 7.0 (GLOVE) ×6 IMPLANT
GLOVE SURG SYN 8.0 (GLOVE) ×6 IMPLANT
GOWN STRL REUS W/ TWL XL LVL3 (GOWN DISPOSABLE) ×2 IMPLANT
GOWN STRL REUS W/TWL XL LVL3 (GOWN DISPOSABLE) ×4
GRADUATE 1200CC STRL 31836 (MISCELLANEOUS) ×3 IMPLANT
INTRAOP MONITOR FEE IMPULS NCS (MISCELLANEOUS) ×1
INTRAOP MONITOR FEE IMPULSE (MISCELLANEOUS) ×2
IV CATH ANGIO 12GX3 LT BLUE (NEEDLE) ×3 IMPLANT
IV CATH ANGIO 14GX1.88 NO SAFE (IV SOLUTION) ×3 IMPLANT
KIT TURNOVER KIT A (KITS) ×3 IMPLANT
MARKER SKIN DUAL TIP RULER LAB (MISCELLANEOUS) ×6 IMPLANT
NEEDLE HYPO 22GX1.5 SAFETY (NEEDLE) ×3 IMPLANT
NEEDLE SPNL 22GX3.5 QUINCKE BK (NEEDLE) ×3 IMPLANT
NS IRRIG 1000ML POUR BTL (IV SOLUTION) ×3 IMPLANT
PACK LAMINECTOMY NEURO (CUSTOM PROCEDURE TRAY) ×3 IMPLANT
PAD ARMBOARD 7.5X6 YLW CONV (MISCELLANEOUS) ×3 IMPLANT
PIN CASPAR 14 (PIN) ×1 IMPLANT
PIN CASPAR 14MM (PIN) ×3
PLATE ZEVO 1LVL 17MM (Plate) ×3 IMPLANT
PUTTY DBF 1CC CORTICAL FIBERS (Putty) ×3 IMPLANT
SCREW 3.5 SELFDRILL 15MM VARI (Screw) ×12 IMPLANT
SPOGE SURGIFLO 8M (HEMOSTASIS) ×2
SPONGE KITTNER 5P (MISCELLANEOUS) ×3 IMPLANT
SPONGE SURGIFLO 8M (HEMOSTASIS) ×1 IMPLANT
SUT POLYSORB 2-0 5X18 GS-10 (SUTURE) ×6 IMPLANT
SUT VICRYL 3-0 CR8 SH (SUTURE) ×3 IMPLANT
SYR 30ML LL (SYRINGE) ×3 IMPLANT
TAPE CLOTH 3X10 WHT NS LF (GAUZE/BANDAGES/DRESSINGS) ×3 IMPLANT
TOWEL OR 17X26 4PK STRL BLUE (TOWEL DISPOSABLE) ×6 IMPLANT
TRAY FOLEY MTR SLVR 16FR STAT (SET/KITS/TRAYS/PACK) IMPLANT
TUBING CONNECTING 10 (TUBING) ×2 IMPLANT
TUBING CONNECTING 10' (TUBING) ×1

## 2020-01-24 NOTE — Progress Notes (Signed)
Cefazolin for surgical prophylaxis  Pt weight 76.4 kg Will order cefazolin 2 g IV x1 preop

## 2020-01-24 NOTE — Care Management CC44 (Signed)
Condition Code 44 Documentation Completed  Patient Details  Name: CARON ODE MRN: [id number] Date of Birth: Feb 06, 1946   Condition Code 44 given:  Yes Patient signature on Condition Code 44 notice:  Yes Documentation of 2 MD's agreement:  Yes Code 44 added to claim:  Yes    Anselm Pancoast, RN 01/24/2020, 4:36 PM

## 2020-01-24 NOTE — Progress Notes (Signed)
Procedure: ACDF C3-4 Procedure date: 01/24/2020 Diagnosis: Cervical myelopathy     History: Amy Vazquez is s/p ACDF C3-4 POD0: Tolerated procedure well. She was evaluated in the inpatient room and is progressing well.  Physical Exam: Vitals:   01/24/20 1510 01/24/20 1538  BP:  (!) 165/77  Pulse:  (!) 102  Resp: 12 16  Temp: (!) 97.2 F (36.2 C) 98.6 F (37 C)  SpO2: 95% 98%    General: Alert and oriented, sitting in bed Strength:5/5 throughout Sensation: intact and symmetric throughout  Skin: neck clean, dry, intact, no evidence of hematoma noted  Data:  Recent Labs  Lab 01/24/20 1622  CREATININE 2.08*   No results for input(s): AST, ALT, ALKPHOS in the last 168 hours.  Invalid input(s): TBILI   No results for input(s): WBC, HGB, HCT, PLT in the last 168 hours. No results for input(s): APTT, INR in the last 168 hours.       Assessment/Plan:  Amy Vazquez is POD0 s/p ACDF C3-4. Will continue to monitor her overnight and have her work with PT.   Lonell Face, NP Department of Neurosurgery

## 2020-01-24 NOTE — Transfer of Care (Signed)
Immediate Anesthesia Transfer of Care Note  Patient: Amy Vazquez  Procedure(s) Performed: ANTERIOR CERVICAL DECOMPRESSION/DISCECTOMY FUSION 2 LEVELS C2-4 (N/A )  Patient Location: PACU  Anesthesia Type:General  Level of Consciousness: drowsy  Airway & Oxygen Therapy: Patient Spontanous Breathing and Patient connected to face mask oxygen  Post-op Assessment: Report given to RN and Post -op Vital signs reviewed and stable  Post vital signs: Reviewed and stable  Last Vitals:  Vitals Value Taken Time  BP 172/73 01/24/20 1332  Temp    Pulse 103 01/24/20 1337  Resp 11 01/24/20 1337  SpO2 99 % 01/24/20 1337  Vitals shown include unvalidated device data.  Last Pain:  Vitals:   01/24/20 1332  TempSrc:   PainSc: (P) Asleep         Complications: No complications documented.

## 2020-01-24 NOTE — Anesthesia Preprocedure Evaluation (Signed)
Anesthesia Evaluation  Patient identified by MRN, date of birth, ID band Patient awake    Reviewed: Allergy & Precautions, H&P , NPO status , Patient's Chart, lab work & pertinent test results, reviewed documented beta blocker date and time   Airway Mallampati: III  TM Distance: >3 FB Neck ROM: limited  Mouth opening: Limited Mouth Opening  Dental  (+) Teeth Intact, Poor Dentition   Pulmonary asthma , former smoker,    Pulmonary exam normal        Cardiovascular Exercise Tolerance: Poor hypertension, On Medications negative cardio ROS Normal cardiovascular exam Rhythm:regular Rate:Normal     Neuro/Psych  Headaches, PSYCHIATRIC DISORDERS Depression    GI/Hepatic Neg liver ROS, GERD  Medicated,  Endo/Other  negative endocrine ROS  Renal/GU Renal disease  negative genitourinary   Musculoskeletal   Abdominal   Peds  Hematology  (+) Blood dyscrasia, anemia ,   Anesthesia Other Findings Past Medical History: No date: Anemia No date: Arthritis     Comment:  FINGERS No date: Asthma     Comment:  WELL CONTROLLED No date: Cervical disc disease No date: Chronic kidney disease     Comment:  STAGE 3-DOES NOT SEE KIDNEY MD No date: Depression No date: GERD (gastroesophageal reflux disease)     Comment:  RARE No date: Headache     Comment:  H/O MIGRAINES No date: Hypertension Past Surgical History: No date: ABDOMINAL HYSTERECTOMY No date: CARPAL TUNNEL RELEASE; Right No date: CHOLECYSTECTOMY No date: COLONOSCOPY 06/24/2016: COLONOSCOPY WITH PROPOFOL; N/A     Comment:  Procedure: COLONOSCOPY WITH PROPOFOL;  Surgeon: Lollie Sails, MD;  Location: [facility] ENDOSCOPY;  Service:               Endoscopy;  Laterality: N/A; No date: NECK SURGERY     Comment:  FUSION  No date: WISDOM TOOTH EXTRACTION   Reproductive/Obstetrics negative OB ROS                             Anesthesia  Physical Anesthesia Plan  ASA: III  Anesthesia Plan: General ETT   Post-op Pain Management:    Induction: Intravenous  PONV Risk Score and Plan:   Airway Management Planned: Video Laryngoscope Planned  Additional Equipment:   Intra-op Plan:   Post-operative Plan:   Informed Consent: I have reviewed the patients History and Physical, chart, labs and discussed the procedure including the risks, benefits and alternatives for the proposed anesthesia with the patient or authorized representative who has indicated his/her understanding and acceptance.     Dental Advisory Given  Plan Discussed with: CRNA  Anesthesia Plan Comments:         Anesthesia Quick Evaluation

## 2020-01-24 NOTE — Care Management Obs Status (Signed)
[place] NOTIFICATION   Patient Details  Name: Amy Vazquez MRN: [id number] Date of Birth: 1945-11-25   Medicare Observation Status Notification Given:  Yes    Anselm Pancoast, RN 01/24/2020, 4:35 PM

## 2020-01-24 NOTE — Anesthesia Procedure Notes (Signed)
Procedure Name: Intubation Date/Time: 01/24/2020 9:38 AM Performed by: Lia Foyer, CRNA Pre-anesthesia Checklist: Patient identified, Emergency Drugs available, Suction available and Patient being monitored Patient Re-evaluated:Patient Re-evaluated prior to induction Oxygen Delivery Method: Circle system utilized Preoxygenation: Pre-oxygenation with 100% oxygen Induction Type: IV induction Ventilation: Mask ventilation without difficulty Laryngoscope Size: McGraph and 3 Grade View: Grade I Nasal Tubes: Right, Nasal prep performed and Nasal Rae Tube size: 6.0 mm Number of attempts: 1 Airway Equipment and Method: Stylet,  Oral airway and Video-laryngoscopy Placement Confirmation: ETT inserted through vocal cords under direct vision,  positive ETCO2 and breath sounds checked- equal and bilateral Secured at: 21 cm Tube secured with: Tape Dental Injury: Teeth and Oropharynx as per pre-operative assessment and Bloody posterior oropharynx  Comments: Prior to induction, patient instructed to apply Afrin spray to bilateral nares.  Patient induced and serial dilation of right nare performed with naso-pharyngeal tubes sized 6 and 7.  Attempt made with gentle pressure to insert 6.5 mm, however,this was unsuccessful.  A 6.0 mm nasal RAE tube inserted with gentle pressure.  Blood noted in nare and posterior naso- and oropharnyx,

## 2020-01-24 NOTE — TOC Initial Note (Signed)
Transition of Care [facility]) - Initial/Assessment Note    Patient Details  Name: Amy Vazquez MRN: [id number] Date of Birth: 1946/04/04  [facility] CM/SW Contact:    Anselm Pancoast, RN Phone Number: 01/24/2020, 4:36 PM  Clinical Narrative:                 Spoke with patients spouse telephonically and updated on Code 88 due to patient being sleepy while recovering from surgery. Spouse with no questions or concerns.         Patient Goals and CMS Choice        Expected Discharge Plan and Services                                                Prior Living Arrangements/Services                       Activities of Daily Living      Permission Sought/Granted                  Emotional Assessment              Admission diagnosis:  Cervical myelopathy ([place]) [G95.9] Patient Active Problem List   Diagnosis Date Noted  . Cervical myelopathy ([place]) 01/24/2020   PCP:  Kirk Ruths, MD Pharmacy:   [id number] - [place], [place] [address] [place] [address] Phone: [phone] Fax: [phone]     Social Determinants of Health (SDOH) Interventions    Readmission Risk Interventions No flowsheet data found.

## 2020-01-24 NOTE — H&P (Signed)
Amy Vazquez is an 74 y.o. female.   Chief Complaint: Myelopathy HPI: Amy Vazquez is here for evaluation of ongoing symptoms of numbness and weakness with pain in her upper extremities. She states that the symptoms have been going on for over the past year. She additionally notices some pain in the right arm and feels the right hand is much worse than the left. She is right-handed. She is having difficulty with fine motor movements and holding onto objects. She does comment on some balance difficulty as well as numbness in her lower extremities. She does have a history of a C5-6 ACDF many years ago for a radiculopathy. She did well from the surgery and denies any current swallowing problems. She had a MRI to evaluate for the symptoms as well as hyperreflexia and is here for review.    Past Medical History:  Diagnosis Date  . Anemia   . Arthritis    FINGERS  . Asthma    WELL CONTROLLED  . Cervical disc disease   . Chronic kidney disease    STAGE 3-DOES NOT SEE KIDNEY MD  . Depression   . GERD (gastroesophageal reflux disease)    RARE  . Headache    H/O MIGRAINES  . Hypertension     Past Surgical History:  Procedure Laterality Date  . ABDOMINAL HYSTERECTOMY    . CARPAL TUNNEL RELEASE Right   . CHOLECYSTECTOMY    . COLONOSCOPY    . COLONOSCOPY WITH PROPOFOL N/A 06/24/2016   Procedure: COLONOSCOPY WITH PROPOFOL;  Surgeon: Lollie Sails, MD;  Location: [facility] ENDOSCOPY;  Service: Endoscopy;  Laterality: N/A;  . NECK SURGERY     FUSION   . WISDOM TOOTH EXTRACTION      Family History  Problem Relation Age of Onset  . Breast cancer Neg Hx    Social History:  reports that she quit smoking about 4 years ago. Her smoking use included cigarettes. She has a 2.50 pack-year smoking history. She has never used smokeless tobacco. She reports that she does not drink alcohol and does not use drugs.  Allergies: No Known Allergies  Medications Prior to Admission  Medication Sig Dispense  Refill  . Ascorbic Acid (VITAMIN C PO) Take 1 tablet by mouth daily.    Marland Kitchen buPROPion (WELLBUTRIN SR) 150 MG 12 hr tablet Take 150 mg by mouth 2 (two) times daily.    . Cholecalciferol 25 MCG (1000 UT) tablet Take 1,000 Units by mouth daily.    . cyanocobalamin 1000 MCG tablet Take 1,000 mcg by mouth daily.    . ferrous sulfate 325 (65 FE) MG tablet Take 325 mg by mouth daily.    Marland Kitchen lisinopril-hydrochlorothiazide (PRINZIDE,ZESTORETIC) 20-25 MG tablet Take 1 tablet by mouth every evening.     Marland Kitchen omeprazole (PRILOSEC OTC) 20 MG tablet Take 20 mg by mouth as needed.    . pravastatin (PRAVACHOL) 40 MG tablet Take 40 mg by mouth at bedtime.     . sertraline (ZOLOFT) 100 MG tablet Take 100 mg by mouth every evening.     . TURMERIC PO Take 1 capsule by mouth daily.    . verapamil (CALAN-SR) 180 MG CR tablet Take 180 mg by mouth at bedtime.    Grant Ruts INHUB 250-50 MCG/DOSE AEPB Inhale 1 puff into the lungs every 12 (twelve) hours.    . cholestyramine (QUESTRAN) 4 g packet Take 1 packet by mouth 2 (two) times daily as needed (diarrhea).  (Patient not taking: Reported on 01/24/2020)  No results found for this or any previous visit (from the past 48 hour(s)). No results found.  Review of Systems General ROS: Negative Psychological ROS: Negative Ophthalmic ROS: Negative ENT ROS: Negative Hematological and Lymphatic ROS: Negative  Endocrine ROS: Negative Respiratory ROS: Negative Cardiovascular ROS: Negative Gastrointestinal ROS: Negative Genito-Urinary ROS: Negative Musculoskeletal ROS: Positive for neck pain Neurological ROS: Positive for numbness, weakness Dermatological ROS: Negative  Blood pressure (!) 148/81, pulse 75, temperature 97.7 F (36.5 C), temperature source Tympanic, resp. rate 17, SpO2 95 %. Physical Exam  General appearance: Alert, cooperative, in no acute distress Head: Normocephalic, atraumatic Eyes: Normal, EOM intact Oropharynx: Moist without lesions XI:DHWYSHU rate  and rhythm Pulm: Clear to auscultation Neck: Supple, range of motion appears full Ext: No edema in LE bilaterally, good distal pulses  Neurologic exam:  Mental status: alertness: alert, affect: normal Speech: fluent and clear Motor:strength symmetric 5/5 in bilateral deltoids, bicep, tricep, interossei, grip, strength 5-5 in bilateral hip flexion, dorsiflexion, plantarflexion Sensory: Decreased light touch in bilateral hands and arm circumferentially. There is decreased light touch in the bilateral lower extremities below the calves. Reflexes: 3+ at bilateral biceps, positive Hoffman's on the right Gait: Wide-based gait  Imaging: MRI cervical spine: There is an evidence of a prior C5-6 fusion with a solid fusion mass. There is straightening of the lordotic curvature. There is an anterior listhesis of C2 on C3 with otherwise intact alignment. There are disc bulges noted at C2-3 and C3-4 with a significant osteophyte at C3-4 also. There is more moderate disc bulge at C4-5. There is moderate central stenosis at C2-3 with severe central stenosis C3-4. The spinal cord is compressed to see 3 4 with obvious cord signal change.    Assessment/Plan Proceed with C2-4 ACDF  Deetta Perla, MD 01/24/2020, 8:22 AM

## 2020-01-24 NOTE — Interval H&P Note (Signed)
History and Physical Interval Note:  01/24/2020 8:25 AM  Amy Vazquez  has presented today for surgery, with the diagnosis of g95.9 cervical myelopathy.  The various methods of treatment have been discussed with the patient and family. After consideration of risks, benefits and other options for treatment, the patient has consented to  Procedure(s): ANTERIOR CERVICAL DECOMPRESSION/DISCECTOMY FUSION 2 LEVELS C2-4 (N/A) as a surgical intervention.  The patient's history has been reviewed, patient examined, no change in status, stable for surgery.  I have reviewed the patient's chart and labs.  Questions were answered to the patient's satisfaction.     Deetta Perla

## 2020-01-25 ENCOUNTER — Encounter: Payer: Self-pay | Admitting: Neurosurgery

## 2020-01-25 DIAGNOSIS — N183 Chronic kidney disease, stage 3 unspecified: Secondary | ICD-10-CM | POA: Diagnosis not present

## 2020-01-25 DIAGNOSIS — I129 Hypertensive chronic kidney disease with stage 1 through stage 4 chronic kidney disease, or unspecified chronic kidney disease: Secondary | ICD-10-CM | POA: Diagnosis not present

## 2020-01-25 DIAGNOSIS — J45909 Unspecified asthma, uncomplicated: Secondary | ICD-10-CM | POA: Diagnosis not present

## 2020-01-25 DIAGNOSIS — M5020 Other cervical disc displacement, unspecified cervical region: Secondary | ICD-10-CM | POA: Diagnosis not present

## 2020-01-25 DIAGNOSIS — Z87891 Personal history of nicotine dependence: Secondary | ICD-10-CM | POA: Diagnosis not present

## 2020-01-25 DIAGNOSIS — Z79899 Other long term (current) drug therapy: Secondary | ICD-10-CM | POA: Diagnosis not present

## 2020-01-25 MED ORDER — POLYETHYLENE GLYCOL 3350 17 G PO PACK: 17.0000 g | PACK | Freq: Every day | ORAL | 0 refills | Status: DC | PRN

## 2020-01-25 MED ORDER — METHOCARBAMOL 750 MG PO TABS
750.0000 mg | ORAL_TABLET | Freq: Four times a day (QID) | ORAL | 0 refills | Status: DC
Start: 2020-01-25 — End: 2021-10-08

## 2020-01-25 MED ORDER — SENNA 8.6 MG PO TABS: 2.0000 | ORAL_TABLET | Freq: Two times a day (BID) | ORAL | 0 refills | Status: DC

## 2020-01-25 MED ORDER — ACETAMINOPHEN 500 MG PO TABS: 1000.0000 mg | ORAL_TABLET | Freq: Three times a day (TID) | ORAL | 0 refills | Status: AC

## 2020-01-25 NOTE — Progress Notes (Signed)
Patient discharging to home today. Discharge instructions provided to patient. Patient verbalized understanding of instructions. IVs removed. Neck brace on and in place. Patient states her husband is picking her up. Patient wheeled to car by staff.  Ronnette Hila

## 2020-01-25 NOTE — Discharge Instructions (Signed)
Please make a follow up appointment with your primary care provider to monitor your kidney function. Please stay hydrated.   Your surgeon has performed an operation on your cervical spine (neck) to relieve pressure on the spinal cord and/or nerves. This involved making an incision in the front of your neck and removing one or more of the discs that support your spine. Next, a small piece of bone, a titanium plate, and screws were used to fuse two or more of the vertebrae (bones) together.  The following are instructions to help in your recovery once you have been discharged from the hospital. Even if you feel well, it is important that you follow these activity guidelines. If you do not let your neck heal properly from the surgery, you can increase the chance of return of your symptoms and other complications.  * Do not take anti-inflammatory medications for 3 months after surgery (naproxen [Aleve], ibuprofen [Advil, Motrin], etc.). These medications can prevent your bones from healing properly.  Activity    No bending, lifting, or twisting (BLT). Avoid lifting objects heavier than 10 pounds (gallon milk jug).  Where possible, avoid household activities that involve lifting, bending, reaching, pushing, or pulling such as laundry, vacuuming, grocery shopping, and childcare. Try to arrange for help from friends and family for these activities while your back heals.  Increase physical activity slowly as tolerated.  Taking short walks is encouraged, but avoid strenuous exercise. Do not jog, run, bicycle, lift weights, or participate in any other exercises unless specifically allowed by your doctor.  Talk to your doctor before resuming sexual activity.  You should not drive until cleared by your doctor.  Until released by your doctor, you should not return to work or school.  You should rest at home and let your body heal.   You may shower three days after your surgery.  After showering, lightly dab  your incision dry. Do not take a tub bath or go swimming until approved by your doctor at your follow-up appointment.  If your doctor ordered a cervical collar (neck brace) for you, you should wear it whenever you are out of bed. You may remove it when lying down or sleeping, but you should wear it at all other times. Not all neck surgeries require a cervical collar.  If you smoke, we strongly recommend that you quit.  Smoking has been proven to interfere with normal bone healing and will dramatically reduce the success rate of your surgery. Please contact [facility] (800-QUIT-NOW) and use the resources at www.[facility].com for assistance in stopping smoking.  Surgical Incision   If you have a dressing on your incision, you may remove it two days after your surgery. Keep your incision area clean and dry.  If you have staples or stitches on your incision, you should have a follow up scheduled for removal. If you do not have staples or stitches, you will have steri-strips (small pieces of surgical tape) or Dermabond glue. The steri-strips/glue should begin to peel away within about a week (it is fine if the steri-strips fall off before then). If the strips are still in place one week after your surgery, you may gently remove them.  Diet           You may return to your usual diet. However, you may experience discomfort when swallowing in the first month after your surgery. This is normal. You may find that softer foods are more comfortable for you to swallow. Be sure to stay  hydrated.  When to Contact us  You may experience pain in your neck and/or pain between your shoulder blades. This is normal and should improve in the next few weeks with the help of pain medication, muscle relaxers, and rest. Some patients report that a warm compress on the back of the neck or between the shoulder blades helps.  However, should you experience any of the following, contact us immediately:  New numbness or  weakness  Pain that is progressively getting worse, and is not relieved by your pain medication, muscle relaxers, rest, and warm compresses  Bleeding, redness, swelling, pain, or drainage from surgical incision  Chills or flu-like symptoms  Fever greater than 101.0 F (38.3 C)  Inability to eat, drink fluids, or take medications  Problems with bowel or bladder functions  Difficulty breathing or shortness of breath  Warmth, tenderness, or swelling in your calf Contact Information  During office hours (Monday-Friday 9 am to 5 pm), please call your physician at [phone] and ask for Berdine Addison  After hours and weekends, please call [phone] and an answering service will put you in touch with either Dr. Lacinda Axon or Dr. Izora Ribas.   For a life-threatening emergency, call 911

## 2020-01-25 NOTE — Op Note (Signed)
Operative Note9/21/2021  PRE-OP DIAGNOSIS:  CervicalMyelopathy * Herniated nucleus pulposus, cervical [M50.20]   POST-OP DIAGNOSIS: Post-Op Diagnosis Codes:  CervicalMyelopathy * Herniated nucleus pulposus, cervical [M50.20]   Procedure(s):  1. ARTHRODESIS, ANTERIOR INTERBODY, INCLUDING DISC SPACE PREPARATION, DISCECTOMY, OSTEOPHYTECTOMY AND DECOMPRESSION OF SPINAL CORD AND/ORNERVE ROOTS; CERVICAL BELOW C2-- C3/4ACDF  2. ANTERIOR INSTRUMENTATION; 2 TO 3 VERTEBRAL SEGMENTS (LIST IN ADDITION TO PRIMARY PROCEDURE) 3. ALLOGRAFT, STRUCTURAL, FOR SPINE SURGERY ONLY (LIST IN ADDITION TO PRIMARY PROCEDURE)   SURGEON: Surgeon(s) and Role:  * Malen Gauze, MD - Primary Lonell Face, NP, Asisstant   ANESTHESIA: General   OPERATIVE FINDINGS: Stenosis atC3/4  OPERATIVE REPORT:  Indications  Ms Dhingra presented to our clinic on8/24with ongoing numbness.Shehad a MRI that showed stenosis atC3/4. She had cord signal changes and signs/symptoms consistent with myelopathy.Given this, we recommended an anterior cervical decompression and fusion to relieve the pressure on the spinal cord and allow for healing. The risks of hematoma, infection, poor bone healing and failure of fusion, cord injury, weakness, numbness, neck pain, stroke, and death were discussed in detail. All questions were answered and the patient elected to proceed with the surgery.   Procedure  After obtaining informed consent, the patient was taken to the Operating Room where general anesthesia was induced and the patient intubated. Vascular access was obtained. Decadron and antibiotics were administered.Neuromonitoing electrodes were placed for MEP and SSEP.The head was slightly extended and imaging used to identify a skin crease overlying the C4 vertebral body. Appropriate padding was performed.   The patient was prepped and draped in the usual sterile fashion and a timeout was performed per protocol. Local  anesthesia was instilled with epinephrine along the planned incision site. A transverse cervical incision was performed on the left in a skin crease. The incision was carried to the level of the platysma and then cautery was used to incise the muscle. Blunt dissection was used to expand the plane and the dissection was carried deep medial to the SCM and carotid sheath being careful to identify the trachea and esophagus medially. The prevertebral fascia was identified and this was bluntly dissected to expose the disc spaces. A needle was placed in the disc space and x-ray confirmed the C3/4disc level.  Next, cautery was used to undermine the longus colli muscles bilaterally and identify the C3/4disc space. Caspar pins were placed at C4 and C3and a retractor system placed under the muscles to complete the exposure. Next, a combination of curettes and Kerrison rongeurs were used to remove the anterior osteophyte at C3/4and then the disc material. A 6mm matchstick was used to shave the endplates of the adjacent bodies. A trial spacer was used to size the graft and then hemostasis obtained.   The microscope was brought into the field for the remainder of the surgery. The matchsitick drill bit was used to remove the osteophyte/disc complex deep to the level of the PLL at C3/4. There wassignificantdisc protrusion at this level that was removed with rongeursto decompress the spinal cord.The PLL was entered with a hook and then the PLL was seen to be partially calcified. This was removed along with remaining disc material to decompress centrally and then out into bilateral neuro foramen. There was some adhesive disc to dura and this was dissected.  A blunt probe was used to confirm no residual stenosis laterally and a curette used to ensure no posterior osteophyte remained. Floseal was used for hemostasis. Allograft was placed,12mm in height and placed slightly recessed to the  anterior edge of vertebral body  to promote arthrodesis.  All monitoring remained stable at this point.  The caspar pins were removed and bone wax placed. The remainder of the osteophytes were removed to allow for plating. Next, a43mm twolevel plate was found to be the adequate size and was placed in the midline and secured with two15 mmscrews at each bone level. Xray was obtained confirming good graft placement and adequate depth of screws. The retractors were removed. The wound was irrigated copiously and hemostasis obtained. The platysma was closed with 2-0 vicryl suture. The dermis was closed with 3-0 Vicryl and Dermabond was placed on the skin.   The patient had general anesthesia reversed and was extubated following the procedure. She awoke following commands with symmetric movement. She was taken to the PACU where she continued recovery and then the ward.   ESTIMATED BLOOD LOSS:100 cc   SPECIMENS: None   IMPLANT PUTTY DBF 1CC CORTICAL FIBERS - [id number]  Inventory Item: PUTTY DBF 1CC CORTICAL FIBERS Serial no.: [id number] Model/Cat no.: [id number]  Implant name: PUTTY DBF 1CC CORTICAL FIBERS - [id number] Laterality: N/A Area: Spine Cervical  Manufacturer: [employer] Date of Manufacture:    Action: Implanted Number Used: 1   Device Identifier:  Device Identifier Type:    BONE WEDGE CONERSTONE [id number] - [id number]  Inventory Item: BONE WEDGE CONERSTONE [id number] Serial no.: [id number] Model/Cat no.: [id number]  Implant name: BONE Corena Herter [id number] - [id number] Laterality: N/A Area:   Manufacturer: [place] Date of Manufacture:    Action: Implanted Number Used: 1   Device Identifier:  Device Identifier Type:    PLATE ZEVO 1LVL 79KW - [id number]  Inventory Item: PLATE ZEVO 1LVL 92EQ Serial no.:  Model/Cat no.: [id number]  Implant name: PLATE ZEVO 1LVL 68TM - [id number] Laterality: N/A Area: Spine Cervical  Manufacturer: [employer] [employer] Date of Manufacture:    Action: Implanted Number  Used: 1   Device Identifier:  Device Identifier Type:    SCREW 3.5 SELFDRILL 15MM VARI - [id number]  Inventory Item: SCREW 3.5 SELFDRILL 15MM VARI Serial no.:  Model/Cat no.: [id number]  Implant name: SCREW 3.5 SELFDRILL 15MM VARI - [id number] Laterality: N/A Area: Spine Cervical  Manufacturer: [employer] Date of Manufacture:    Action: Implanted Number Used: 4   Device Identifier:  Device Identifier Type:        ATTESTATION:  I performed the procedure in its entirety with the assistance ofChristine Zdeb, physician assistant   Deetta Perla  [phone]

## 2020-01-25 NOTE — Discharge Summary (Signed)
Procedure: ACDF C3-4 Procedure date: 01/24/2020 Diagnosis: Cervical myelopathy     History: Amy Vazquez is s/p ACDF C3-4 POD1: [name] is POD1 from ACDF C3-4, she feels very well this morning and is eager for discharge. She has been ambulating around the room and denies any new weakness, numbness, tingling.  POD0: Tolerated procedure well. She was evaluated in the inpatient room and is progressing well.  Physical Exam: Vitals:   01/25/20 0313 01/25/20 0821  BP: (!) 162/68 (!) 152/73  Pulse: 79 72  Resp: 16 14  Temp: 98.6 F (37 C) 98.6 F (37 C)  SpO2: 98% 97%    General: Alert and oriented, sitting in bed Strength:5/5 throughout Sensation: intact and symmetric throughout  Skin: neck clean, dry, intact, no evidence of hematoma noted  Data:  Recent Labs  Lab 01/24/20 1622  CREATININE 2.08*   No results for input(s): AST, ALT, ALKPHOS in the last 168 hours.  Invalid input(s): TBILI   No results for input(s): WBC, HGB, HCT, PLT in the last 168 hours. No results for input(s): APTT, INR in the last 168 hours.       Assessment/Plan:  Amy Vazquez is POD0 s/p ACDF C3-4.  She has been ambulating around the room and does not feel that she needs PT. Her postop Xrays have been completed and she is tolerating PO.   She has known CKD and her creatinine was 2.08 postoperatively, she was encouraged to increase oral hydration and we have contacted her PCP to facilitate a hospital follow up of this.   She is cleared for discharge and will follow up with me in two weeks in clinic.   Lonell Face, NP Department of Neurosurgery

## 2020-01-26 NOTE — Anesthesia Postprocedure Evaluation (Signed)
Anesthesia Post Note  Patient: Amy Vazquez  Procedure(s) Performed: ANTERIOR CERVICAL DECOMPRESSION/DISCECTOMY FUSION 2 LEVELS C2-4 (N/A )  Patient location during evaluation: PACU Anesthesia Type: General Level of consciousness: awake and alert Pain management: pain level controlled Vital Signs Assessment: post-procedure vital signs reviewed and stable Respiratory status: spontaneous breathing, nonlabored ventilation, respiratory function stable and patient connected to nasal cannula oxygen Cardiovascular status: blood pressure returned to baseline and stable Postop Assessment: no apparent nausea or vomiting Anesthetic complications: no   No complications documented.   Last Vitals:  Vitals:   01/25/20 0313 01/25/20 0821  BP: (!) 162/68 (!) 152/73  Pulse: 79 72  Resp: 16 14  Temp: 37 C 37 C  SpO2: 98% 97%    Last Pain:  Vitals:   01/25/20 1005  TempSrc:   PainSc: Gantt Saloni Lablanc

## 2020-02-29 DIAGNOSIS — M50321 Other cervical disc degeneration at C4-C5 level: Secondary | ICD-10-CM | POA: Diagnosis not present

## 2020-02-29 DIAGNOSIS — Z981 Arthrodesis status: Secondary | ICD-10-CM | POA: Diagnosis not present

## 2020-02-29 DIAGNOSIS — M4322 Fusion of spine, cervical region: Secondary | ICD-10-CM | POA: Diagnosis not present

## 2020-03-02 DIAGNOSIS — M19041 Primary osteoarthritis, right hand: Secondary | ICD-10-CM | POA: Diagnosis not present

## 2020-03-02 DIAGNOSIS — M19042 Primary osteoarthritis, left hand: Secondary | ICD-10-CM | POA: Diagnosis not present

## 2020-03-02 DIAGNOSIS — R768 Other specified abnormal immunological findings in serum: Secondary | ICD-10-CM | POA: Diagnosis not present

## 2020-04-25 DIAGNOSIS — M4312 Spondylolisthesis, cervical region: Secondary | ICD-10-CM | POA: Diagnosis not present

## 2020-04-25 DIAGNOSIS — Z981 Arthrodesis status: Secondary | ICD-10-CM | POA: Diagnosis not present

## 2020-04-25 DIAGNOSIS — M47812 Spondylosis without myelopathy or radiculopathy, cervical region: Secondary | ICD-10-CM | POA: Diagnosis not present

## 2020-04-25 DIAGNOSIS — M4322 Fusion of spine, cervical region: Secondary | ICD-10-CM | POA: Diagnosis not present

## 2020-06-09 DIAGNOSIS — E7800 Pure hypercholesterolemia, unspecified: Secondary | ICD-10-CM | POA: Diagnosis not present

## 2020-06-09 DIAGNOSIS — I10 Essential (primary) hypertension: Secondary | ICD-10-CM | POA: Diagnosis not present

## 2020-06-09 DIAGNOSIS — N184 Chronic kidney disease, stage 4 (severe): Secondary | ICD-10-CM | POA: Diagnosis not present

## 2020-06-16 DIAGNOSIS — N184 Chronic kidney disease, stage 4 (severe): Secondary | ICD-10-CM | POA: Diagnosis not present

## 2020-06-16 DIAGNOSIS — F325 Major depressive disorder, single episode, in full remission: Secondary | ICD-10-CM | POA: Diagnosis not present

## 2020-06-16 DIAGNOSIS — J4520 Mild intermittent asthma, uncomplicated: Secondary | ICD-10-CM | POA: Diagnosis not present

## 2020-06-16 DIAGNOSIS — N2581 Secondary hyperparathyroidism of renal origin: Secondary | ICD-10-CM | POA: Diagnosis not present

## 2020-06-16 DIAGNOSIS — I129 Hypertensive chronic kidney disease with stage 1 through stage 4 chronic kidney disease, or unspecified chronic kidney disease: Secondary | ICD-10-CM | POA: Diagnosis not present

## 2020-06-16 DIAGNOSIS — E7800 Pure hypercholesterolemia, unspecified: Secondary | ICD-10-CM | POA: Diagnosis not present

## 2020-07-24 DIAGNOSIS — Z981 Arthrodesis status: Secondary | ICD-10-CM | POA: Diagnosis not present

## 2020-07-24 DIAGNOSIS — M50321 Other cervical disc degeneration at C4-C5 level: Secondary | ICD-10-CM | POA: Diagnosis not present

## 2020-07-24 DIAGNOSIS — M4692 Unspecified inflammatory spondylopathy, cervical region: Secondary | ICD-10-CM | POA: Diagnosis not present

## 2020-07-24 DIAGNOSIS — M50323 Other cervical disc degeneration at C6-C7 level: Secondary | ICD-10-CM | POA: Diagnosis not present

## 2020-08-28 DIAGNOSIS — H35341 Macular cyst, hole, or pseudohole, right eye: Secondary | ICD-10-CM | POA: Diagnosis not present

## 2020-09-21 DIAGNOSIS — M5440 Lumbago with sciatica, unspecified side: Secondary | ICD-10-CM | POA: Diagnosis not present

## 2020-09-21 DIAGNOSIS — M545 Low back pain, unspecified: Secondary | ICD-10-CM | POA: Diagnosis not present

## 2020-12-11 DIAGNOSIS — N184 Chronic kidney disease, stage 4 (severe): Secondary | ICD-10-CM | POA: Diagnosis not present

## 2020-12-11 DIAGNOSIS — J4520 Mild intermittent asthma, uncomplicated: Secondary | ICD-10-CM | POA: Diagnosis not present

## 2020-12-11 DIAGNOSIS — I10 Essential (primary) hypertension: Secondary | ICD-10-CM | POA: Diagnosis not present

## 2020-12-18 DIAGNOSIS — Z5320 Procedure and treatment not carried out because of patient's decision for unspecified reasons: Secondary | ICD-10-CM | POA: Diagnosis not present

## 2020-12-18 DIAGNOSIS — I129 Hypertensive chronic kidney disease with stage 1 through stage 4 chronic kidney disease, or unspecified chronic kidney disease: Secondary | ICD-10-CM | POA: Diagnosis not present

## 2020-12-18 DIAGNOSIS — N184 Chronic kidney disease, stage 4 (severe): Secondary | ICD-10-CM | POA: Diagnosis not present

## 2020-12-18 DIAGNOSIS — N2581 Secondary hyperparathyroidism of renal origin: Secondary | ICD-10-CM | POA: Diagnosis not present

## 2020-12-18 DIAGNOSIS — I700 Atherosclerosis of aorta: Secondary | ICD-10-CM | POA: Diagnosis not present

## 2020-12-18 DIAGNOSIS — Z0000 Encounter for general adult medical examination without abnormal findings: Secondary | ICD-10-CM | POA: Diagnosis not present

## 2020-12-18 DIAGNOSIS — M5431 Sciatica, right side: Secondary | ICD-10-CM | POA: Diagnosis not present

## 2020-12-18 DIAGNOSIS — F325 Major depressive disorder, single episode, in full remission: Secondary | ICD-10-CM | POA: Diagnosis not present

## 2020-12-19 DIAGNOSIS — M5416 Radiculopathy, lumbar region: Secondary | ICD-10-CM | POA: Diagnosis not present

## 2020-12-19 DIAGNOSIS — M5136 Other intervertebral disc degeneration, lumbar region: Secondary | ICD-10-CM | POA: Diagnosis not present

## 2020-12-19 DIAGNOSIS — M4726 Other spondylosis with radiculopathy, lumbar region: Secondary | ICD-10-CM | POA: Diagnosis not present

## 2021-01-11 DIAGNOSIS — M6281 Muscle weakness (generalized): Secondary | ICD-10-CM | POA: Diagnosis not present

## 2021-01-11 DIAGNOSIS — M545 Low back pain, unspecified: Secondary | ICD-10-CM | POA: Diagnosis not present

## 2021-01-11 DIAGNOSIS — M25551 Pain in right hip: Secondary | ICD-10-CM | POA: Diagnosis not present

## 2021-01-30 ENCOUNTER — Other Ambulatory Visit (HOSPITAL_COMMUNITY): Payer: Self-pay | Admitting: Family Medicine

## 2021-01-30 ENCOUNTER — Other Ambulatory Visit: Payer: Self-pay | Admitting: Family Medicine

## 2021-01-30 DIAGNOSIS — M5416 Radiculopathy, lumbar region: Secondary | ICD-10-CM

## 2021-01-30 DIAGNOSIS — M5136 Other intervertebral disc degeneration, lumbar region: Secondary | ICD-10-CM | POA: Diagnosis not present

## 2021-01-30 DIAGNOSIS — M5441 Lumbago with sciatica, right side: Secondary | ICD-10-CM | POA: Diagnosis not present

## 2021-01-30 DIAGNOSIS — Z23 Encounter for immunization: Secondary | ICD-10-CM | POA: Diagnosis not present

## 2021-01-30 DIAGNOSIS — M4726 Other spondylosis with radiculopathy, lumbar region: Secondary | ICD-10-CM | POA: Diagnosis not present

## 2021-01-30 DIAGNOSIS — G8929 Other chronic pain: Secondary | ICD-10-CM | POA: Diagnosis not present

## 2021-02-06 ENCOUNTER — Ambulatory Visit: Payer: Medicare HMO

## 2021-02-08 DIAGNOSIS — N1832 Chronic kidney disease, stage 3b: Secondary | ICD-10-CM | POA: Diagnosis not present

## 2021-02-08 DIAGNOSIS — D631 Anemia in chronic kidney disease: Secondary | ICD-10-CM | POA: Diagnosis not present

## 2021-02-08 DIAGNOSIS — Q600 Renal agenesis, unilateral: Secondary | ICD-10-CM | POA: Diagnosis not present

## 2021-02-08 DIAGNOSIS — R809 Proteinuria, unspecified: Secondary | ICD-10-CM | POA: Diagnosis not present

## 2021-02-08 DIAGNOSIS — I10 Essential (primary) hypertension: Secondary | ICD-10-CM | POA: Diagnosis not present

## 2021-02-08 DIAGNOSIS — R609 Edema, unspecified: Secondary | ICD-10-CM | POA: Diagnosis not present

## 2021-02-08 DIAGNOSIS — R8290 Unspecified abnormal findings in urine: Secondary | ICD-10-CM | POA: Diagnosis not present

## 2021-02-09 ENCOUNTER — Other Ambulatory Visit (HOSPITAL_BASED_OUTPATIENT_CLINIC_OR_DEPARTMENT_OTHER): Payer: Self-pay | Admitting: Nephrology

## 2021-02-09 ENCOUNTER — Other Ambulatory Visit: Payer: Self-pay | Admitting: Nephrology

## 2021-02-09 DIAGNOSIS — D631 Anemia in chronic kidney disease: Secondary | ICD-10-CM

## 2021-02-09 DIAGNOSIS — I10 Essential (primary) hypertension: Secondary | ICD-10-CM

## 2021-02-09 DIAGNOSIS — R609 Edema, unspecified: Secondary | ICD-10-CM

## 2021-02-09 DIAGNOSIS — R8290 Unspecified abnormal findings in urine: Secondary | ICD-10-CM

## 2021-02-09 DIAGNOSIS — N1832 Chronic kidney disease, stage 3b: Secondary | ICD-10-CM

## 2021-02-09 DIAGNOSIS — Q600 Renal agenesis, unilateral: Secondary | ICD-10-CM

## 2021-02-13 ENCOUNTER — Ambulatory Visit
Admission: RE | Admit: 2021-02-13 | Discharge: 2021-02-13 | Disposition: A | Discharge status: Home / Self Care | Payer: Medicare HMO | Source: Ambulatory Visit | Attending: Family Medicine | Admitting: Family Medicine

## 2021-02-13 DIAGNOSIS — M5416 Radiculopathy, lumbar region: Secondary | ICD-10-CM | POA: Diagnosis not present

## 2021-02-13 DIAGNOSIS — M48061 Spinal stenosis, lumbar region without neurogenic claudication: Secondary | ICD-10-CM | POA: Diagnosis not present

## 2021-02-13 DIAGNOSIS — M47816 Spondylosis without myelopathy or radiculopathy, lumbar region: Secondary | ICD-10-CM | POA: Diagnosis not present

## 2021-02-13 IMAGING — MR MR LUMBAR SPINE W/O CM
4 of 5 series · 32 of 48 positions shown · non-contrast
Comparison: Report of lumbar radiographs [DATE] (no images
available).

CLINICAL DATA: 75-year-old female with low back pain radiating to
the right leg, ankle with numbness and tingling.

EXAM:
MRI LUMBAR SPINE WITHOUT CONTRAST
TECHNIQUE: Multiplanar, multisequence MR imaging of the lumbar spine was
performed. No intravenous contrast was administered.

[Series 5: T2 · sagittal · 4.0mm · 0.81mm/px · 8 of 17 slices shown (1 of 2)]
[im 1/17]
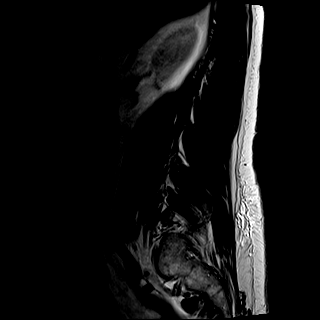
[im 3/17]
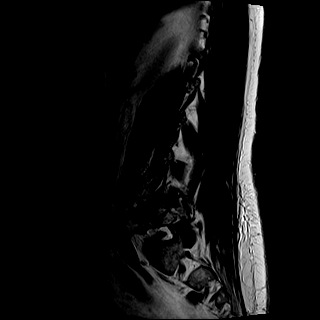
[im 5/17]
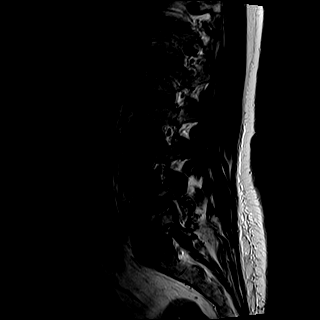
[im 7/17]
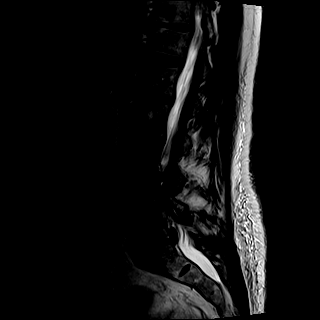
[im 10/17]
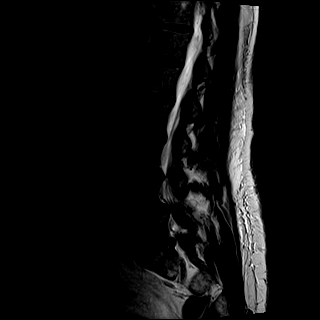
[im 12/17]
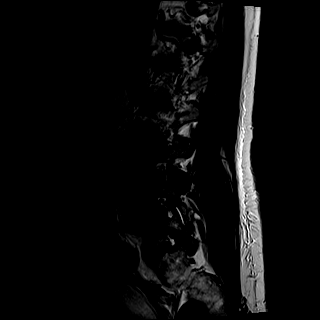
[im 14/17]
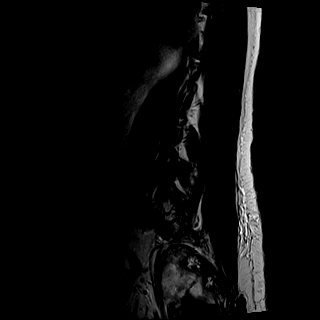
[im 17/17]
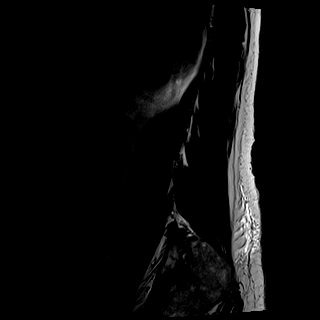

[Series 6: T1 · sagittal · 4.0mm · 0.81mm/px · 7 of 17 slices shown (1 of 2)]
[im 1/17]
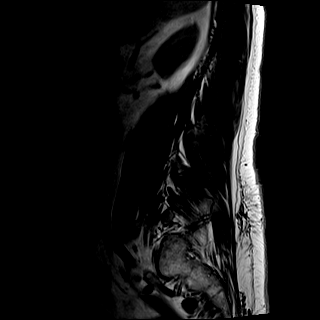
[im 3/17]
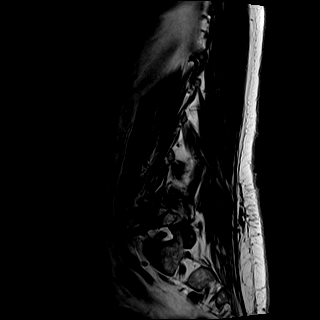
[im 6/17]
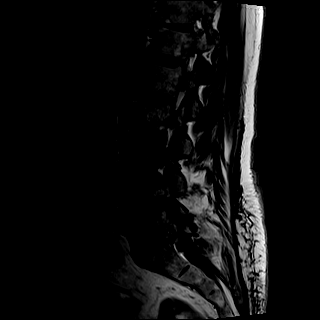
[im 9/17]
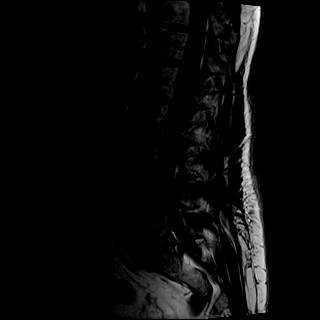
[im 11/17]
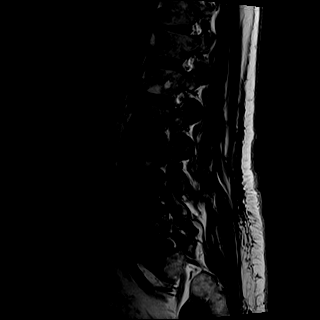
[im 14/17]
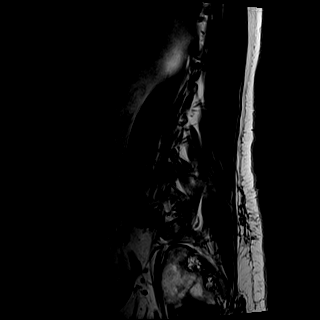
[im 17/17]
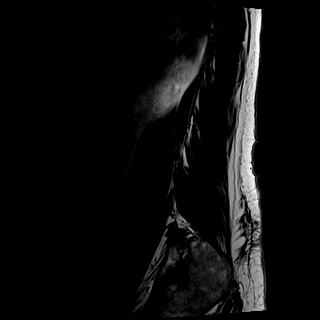

[Series 8: T2 · axial · 4.0mm · 0.78mm/px · z∈[-138,+65]mm · 9 of 32 slices shown (2 of 2)]
[im 1/32]
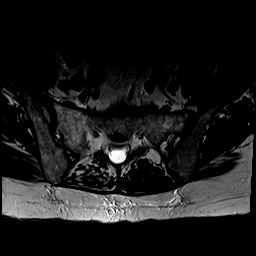
[im 6/32]
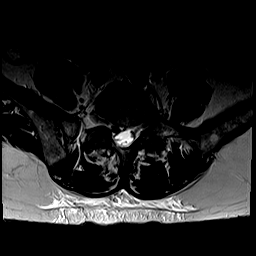
[im 11/32]
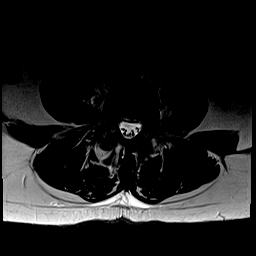
[im 13/32]
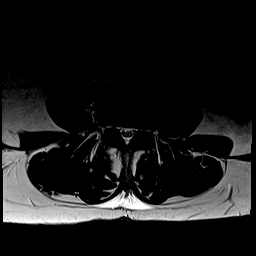
[im 16/32]
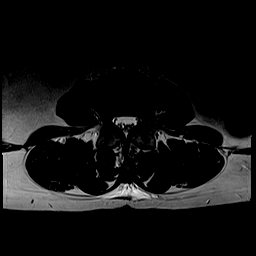
[im 19/32]
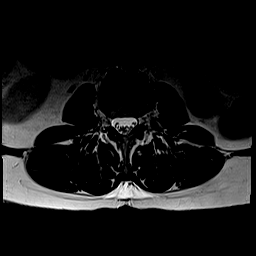
[im 21/32]
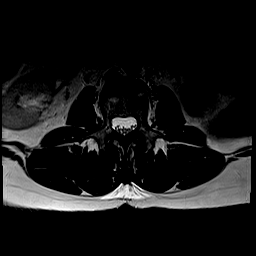
[im 26/32]
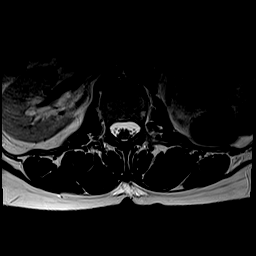
[im 32/32]
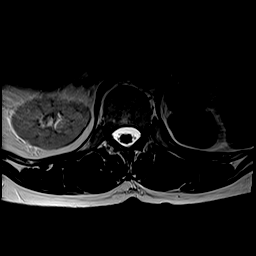

[Series 9: T1 · axial · 4.0mm · 0.39mm/px · z∈[-138,+35]mm · 8 of 32 slices shown (2 of 2)]
[im 1/32]
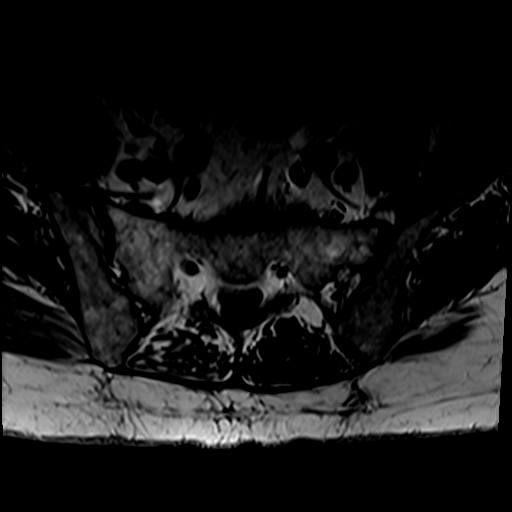
[im 6/32]
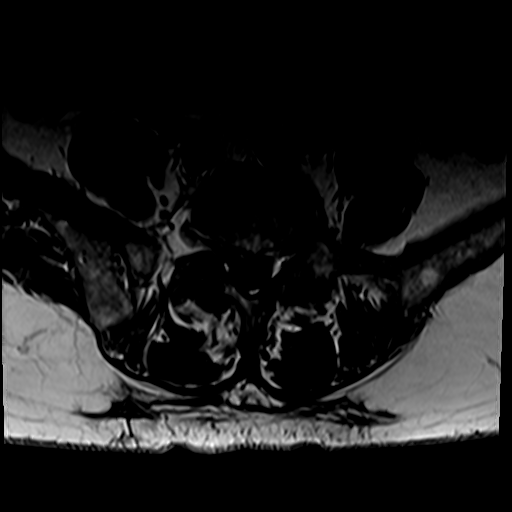
[im 11/32]
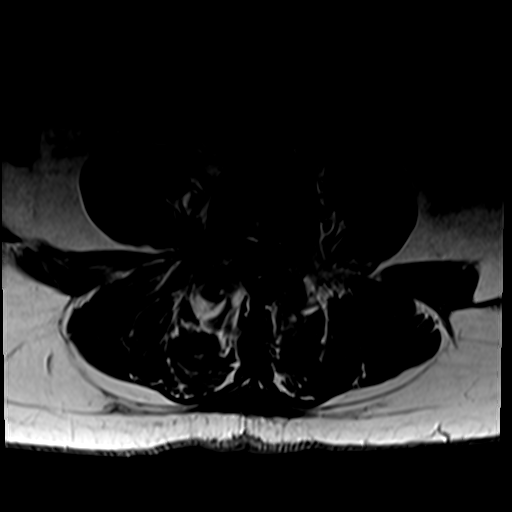
[im 13/32]
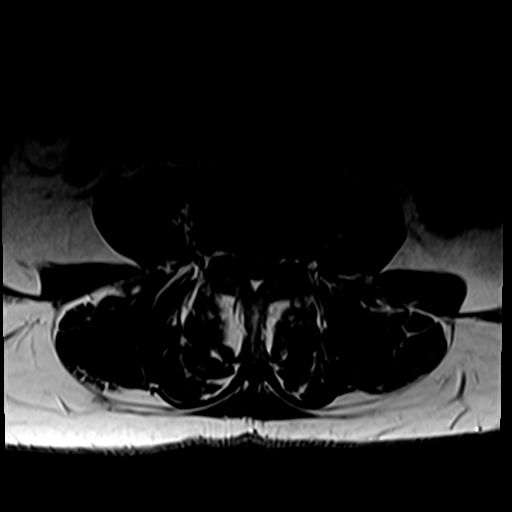
[im 16/32]
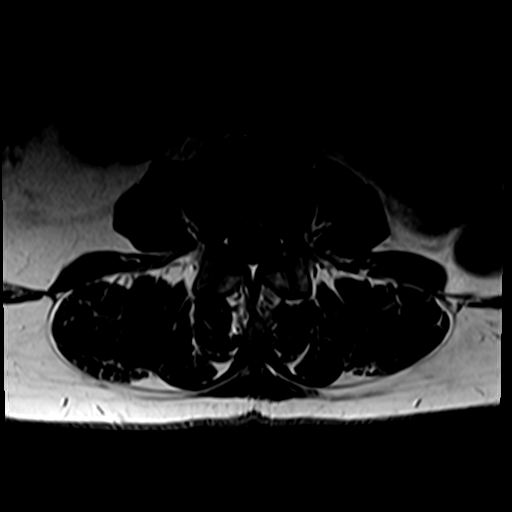
[im 19/32]
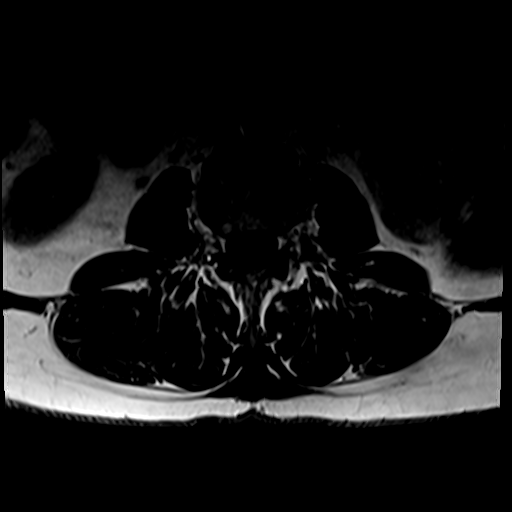
[im 21/32]
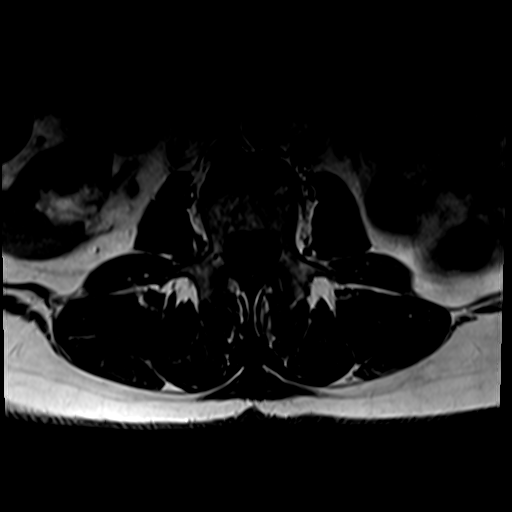
[im 26/32]
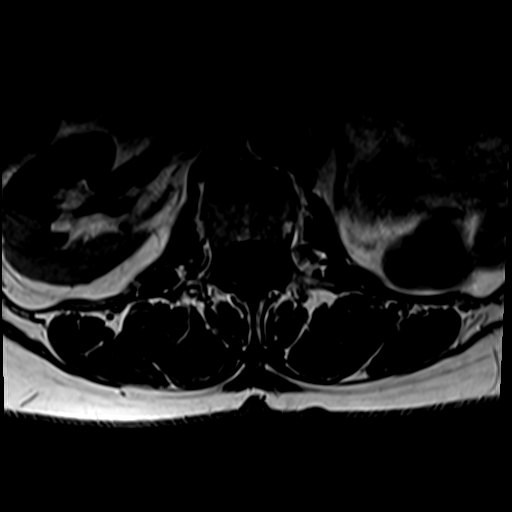

[32 of 48 positions shown; findings below may reference images not displayed]

FINDINGS: Segmentation: Lumbar segmentation appears to be normal and will be
designated as such for this report.

Alignment: Grade 1 anterolisthesis of L4 on L5 measures 7 mm. Mild
straightening of lumbar lordosis elsewhere. Subtle levoconvex lower
lumbar scoliosis. Subtle anterolisthesis of T11 on T12.

Vertebrae: Degenerative endplate marrow edema at L4-L5 eccentric to
the right and L5-S1 eccentric to the left. Chronic degenerative
endplate marrow changes at T12-L1 anteriorly. Normal background bone
marrow signal. Intact visible sacrum and SI joints.

Conus medullaris and cauda equina: Conus extends to the L1-L2 level.
No lower spinal cord or conus signal abnormality.

Paraspinal and other soft tissues: Absent left kidney at the left
renal fossa. Left adrenal gland is visible. Partially visible right
kidney is unremarkable. No pelvic kidney is visible. Otherwise
negative.

Disc levels:

T11-T12: Subtle anterolisthesis with disc desiccation and disc space
loss. Mild disc bulging and facet hypertrophy. No significant
stenosis.

T12-L1:  Negative.

L1-L2:  Negative.

L2-L3: Mild leftward disc bulging. Mild left facet hypertrophy. No
significant stenosis.

L3-L4: Circumferential disc bulge eccentric to the left. Mild facet
and ligament flavum hypertrophy. Broad-based posterior component of
disc. Mild spinal stenosis. Up to moderate left and mild to moderate
right L3 foraminal stenosis.

L4-L5: Anterolisthesis. Disc desiccation, disc space loss, and bulky
circumferential disc/pseudo disc. Severe involvement of the right
neural foramen plus superimposed right greater than left moderate to
severe facet hypertrophy. Degenerative facet joint fluid. Small
subligamentous synovial cyst on the left.

Mild to moderate spinal stenosis. Mild to moderate left and moderate
to severe right lateral recess stenosis (L5 nerve levels). Mild left
but severe right L4 neural foraminal stenosis.

L5-S1: Broad-based right paracentral and cephalad disc extrusion
(series 5, image 7 and series 8, images 27 and 28) with annular
fissure. Underlying mild circumferential disc bulge. Mild to
moderate facet hypertrophy greater on the right. Moderate to severe
right lateral recess stenosis primarily at the descending right S1
nerve level. No spinal stenosis. Up to moderate left, mild right L5
foraminal stenosis.
IMPRESSION: 1. Lumbar degeneration with stenosis greater on the right at both
L4-L5 and L5-S1:
- Grade 1 anterolisthesis at L4-L5 with bulky rightward disc and
posterior element degeneration. Severe right neural foraminal
stenosis. Moderate to severe right lateral recess stenosis andmild
to moderate spinal stenosis. Query Right L4 and/or L5 radiculitis.
- Right paracentral and cephalad disc extrusion at L5-S1 with
moderate to severe lateral recess stenosis. Query Right S1
radiculitis.

2. Mild spinal stenosis at L3-L4 with up to moderate left > right L3
neural foraminal stenosis.

3. Solitary right kidney.

## 2021-02-22 ENCOUNTER — Ambulatory Visit
Admission: RE | Admit: 2021-02-22 | Discharge: 2021-02-22 | Disposition: A | Discharge status: Home / Self Care | Payer: Medicare HMO | Source: Ambulatory Visit | Attending: Nephrology | Admitting: Nephrology

## 2021-02-22 ENCOUNTER — Other Ambulatory Visit: Payer: Self-pay

## 2021-02-22 DIAGNOSIS — Q600 Renal agenesis, unilateral: Secondary | ICD-10-CM | POA: Diagnosis not present

## 2021-02-22 DIAGNOSIS — R609 Edema, unspecified: Secondary | ICD-10-CM | POA: Insufficient documentation

## 2021-02-22 DIAGNOSIS — I10 Essential (primary) hypertension: Secondary | ICD-10-CM | POA: Diagnosis not present

## 2021-02-22 DIAGNOSIS — D631 Anemia in chronic kidney disease: Secondary | ICD-10-CM | POA: Insufficient documentation

## 2021-02-22 DIAGNOSIS — N1832 Chronic kidney disease, stage 3b: Secondary | ICD-10-CM | POA: Insufficient documentation

## 2021-02-22 DIAGNOSIS — R8290 Unspecified abnormal findings in urine: Secondary | ICD-10-CM | POA: Diagnosis not present

## 2021-02-22 IMAGING — US US RENAL
1 series · 15 of 15 positions shown · non-contrast
Comparison: Ultrasound abdomen [DATE].

CLINICAL DATA: stage 3B chronic kidney disease; hypertension;
edema; anemiai n stage 3B chronic kidney disease; abnoraml finding
in urine; ageneis left kidney

EXAM:
RENAL / URINARY TRACT ULTRASOUND COMPLETE

[Series 1: us renal · 15 of 15 slices shown]
[im 1/15]
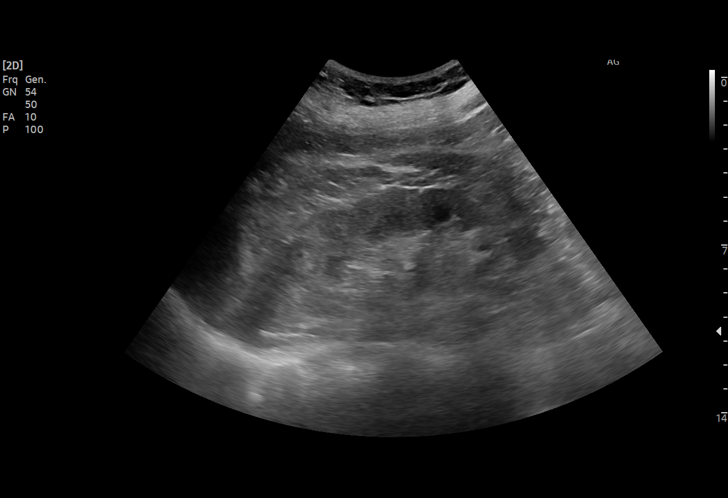
[im 2/15]
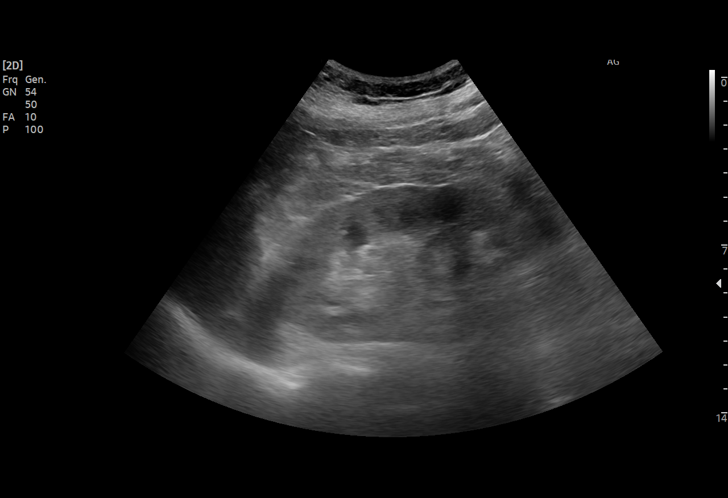
[im 3/15]
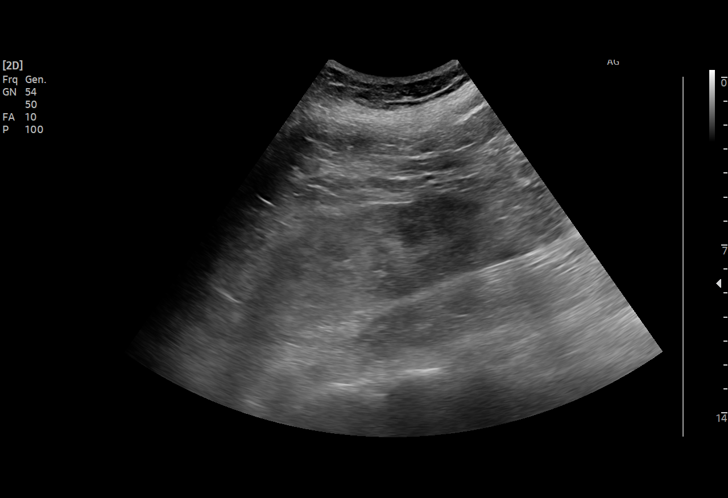
[im 4/15]
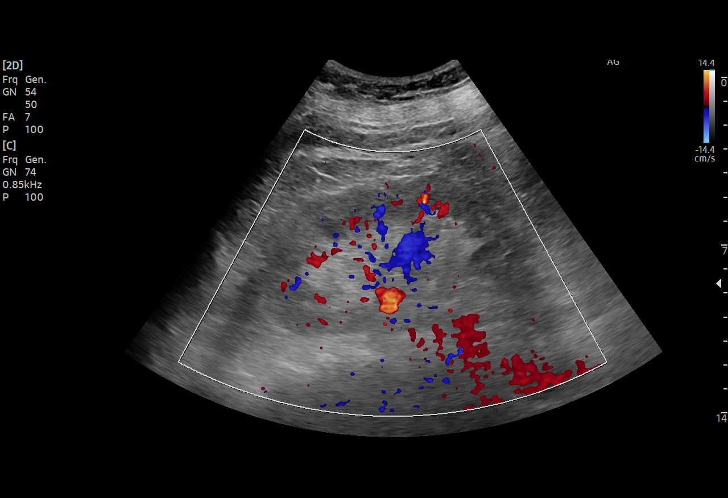
[im 5/15]
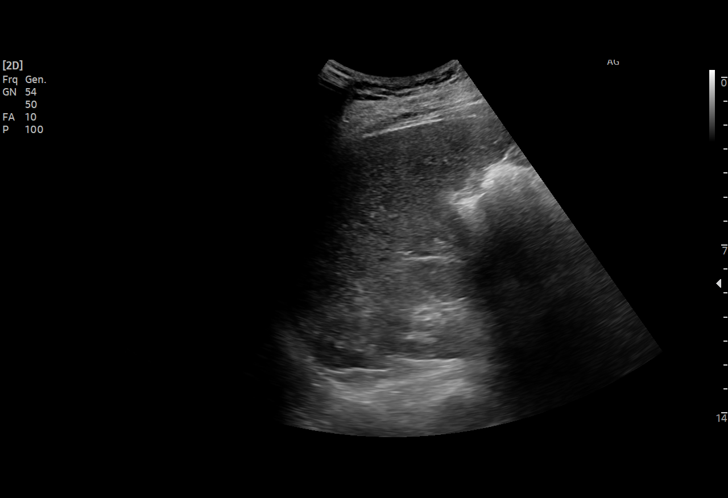
[im 6/15]
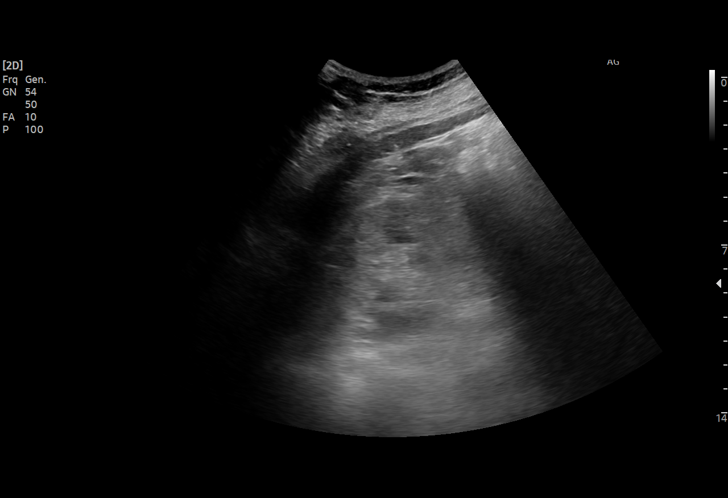
[im 7/15]
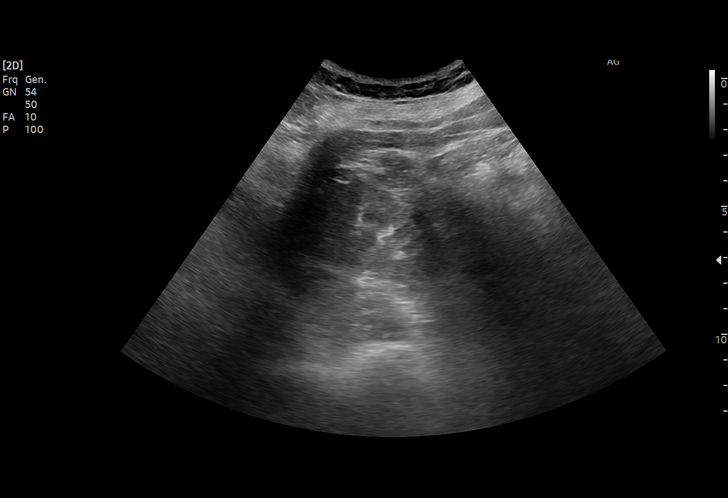
[im 8/15]
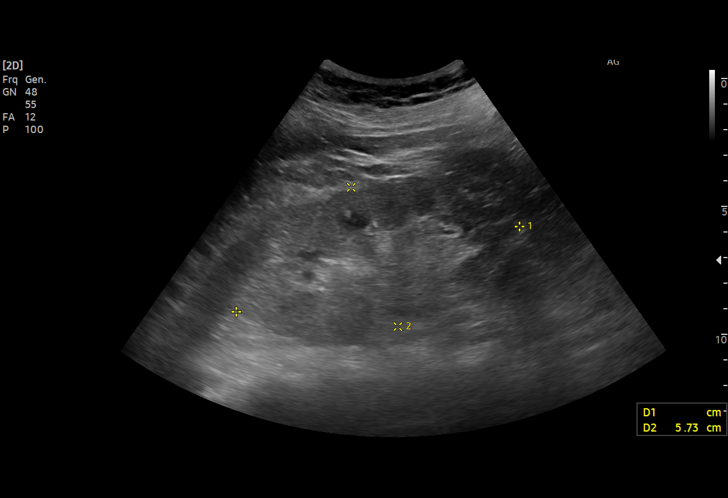
[im 9/15]
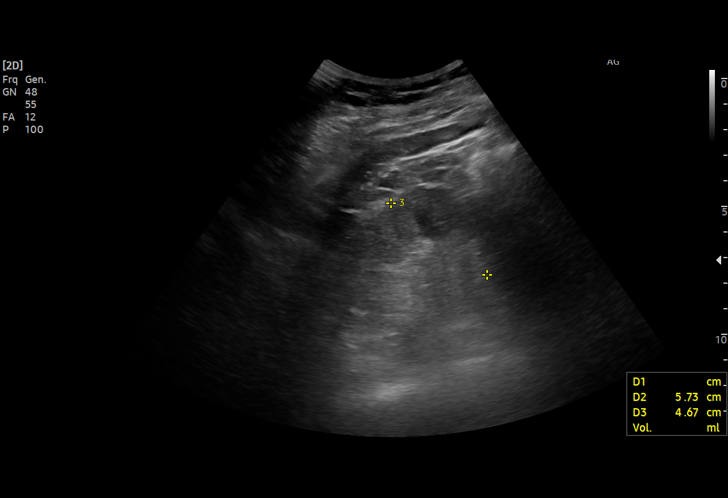
[im 10/15]
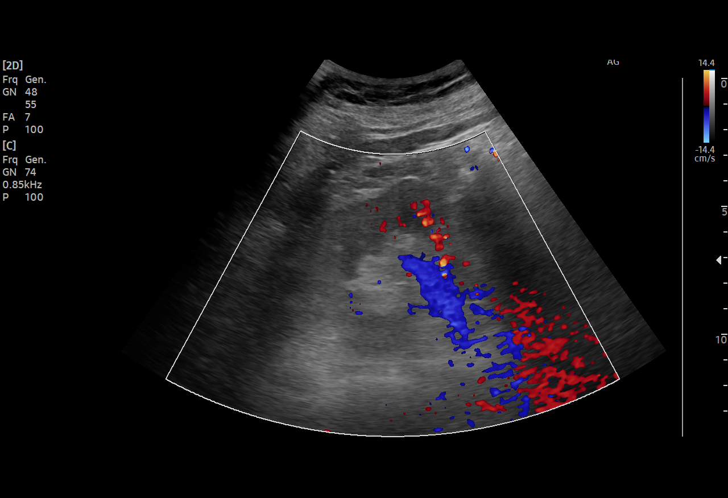
[im 11/15]
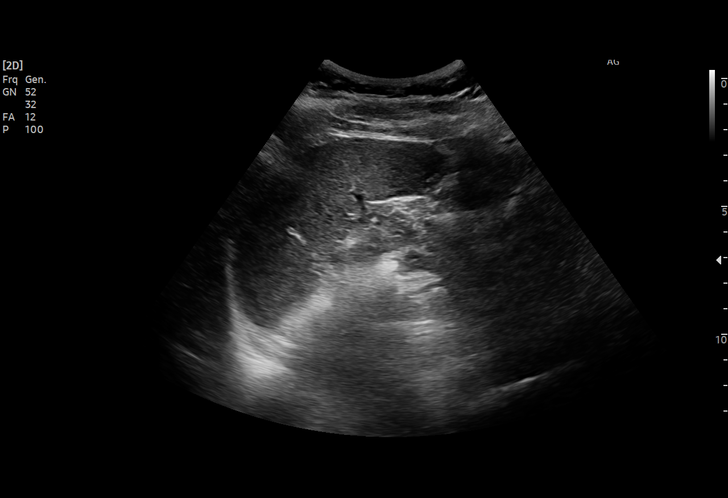
[im 12/15]
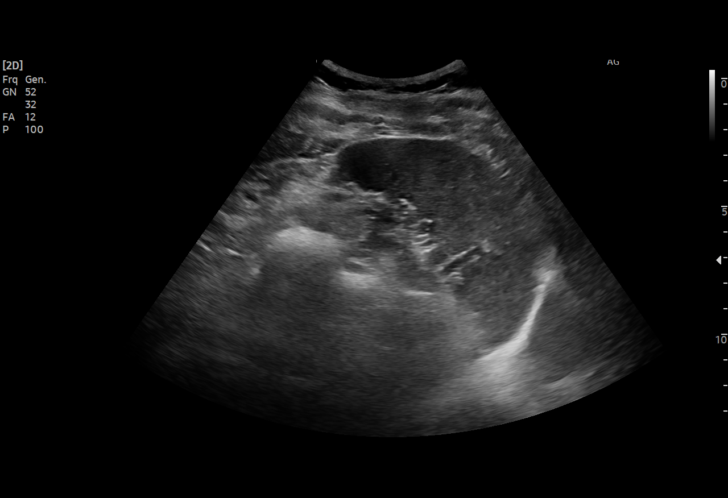
[im 13/15]
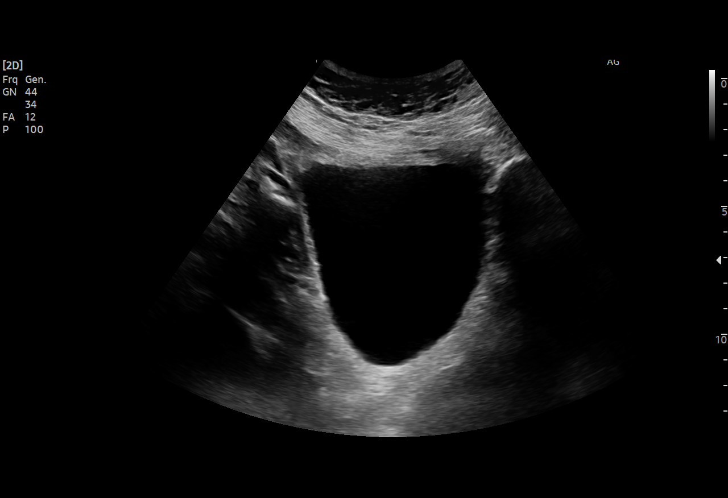
[im 14/15]
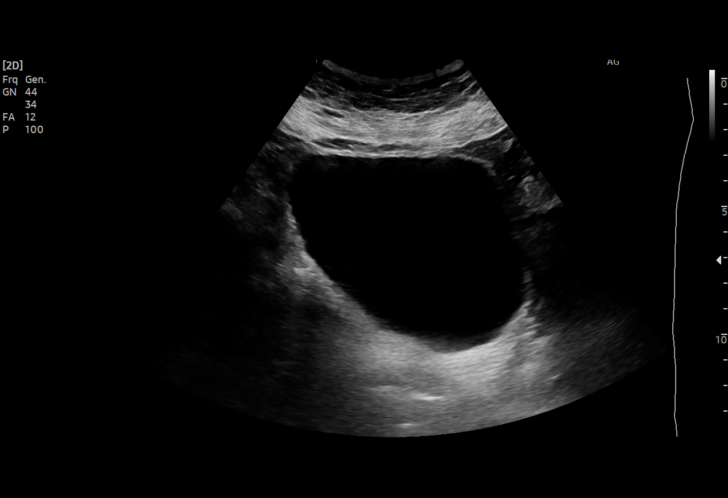
[im 15/15]
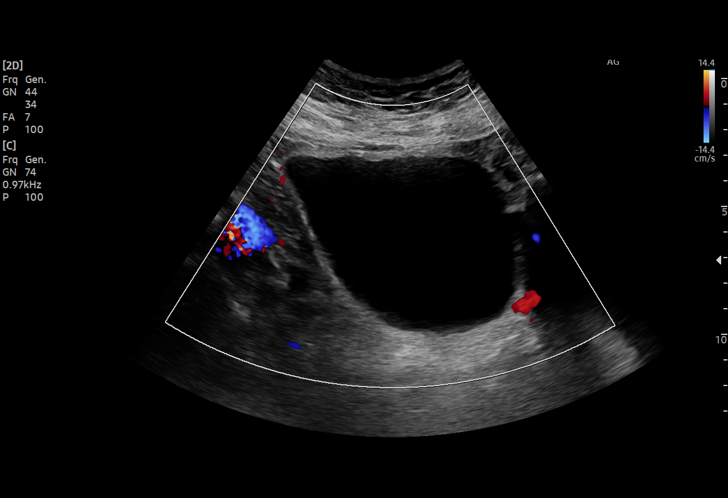

[15 of 15 positions shown; findings below may reference images not displayed]

FINDINGS: Right Kidney:

Renal measurements: 11.5 x 5.7 x 4.7 cm = volume: 162 mL. Increased
echogenicity. No mass or hydronephrosis visualized.

Left Kidney:

Not visualized.

Urinary bladder:

Appears normal for degree of bladder distention.

Other:

None.
IMPRESSION: 1. Increased echogenicity of the right kidney consistent with renal
parenchymal disease.
2. Left renal agenesis

## 2021-02-28 DIAGNOSIS — M5126 Other intervertebral disc displacement, lumbar region: Secondary | ICD-10-CM | POA: Diagnosis not present

## 2021-02-28 DIAGNOSIS — Z0100 Encounter for examination of eyes and vision without abnormal findings: Secondary | ICD-10-CM | POA: Diagnosis not present

## 2021-02-28 DIAGNOSIS — D3131 Benign neoplasm of right choroid: Secondary | ICD-10-CM | POA: Diagnosis not present

## 2021-02-28 DIAGNOSIS — H35341 Macular cyst, hole, or pseudohole, right eye: Secondary | ICD-10-CM | POA: Diagnosis not present

## 2021-02-28 DIAGNOSIS — M5416 Radiculopathy, lumbar region: Secondary | ICD-10-CM | POA: Diagnosis not present

## 2021-03-01 DIAGNOSIS — M159 Polyosteoarthritis, unspecified: Secondary | ICD-10-CM | POA: Diagnosis not present

## 2021-03-01 DIAGNOSIS — R768 Other specified abnormal immunological findings in serum: Secondary | ICD-10-CM | POA: Diagnosis not present

## 2021-03-12 DIAGNOSIS — N1832 Chronic kidney disease, stage 3b: Secondary | ICD-10-CM | POA: Diagnosis not present

## 2021-03-12 DIAGNOSIS — D631 Anemia in chronic kidney disease: Secondary | ICD-10-CM | POA: Diagnosis not present

## 2021-03-12 DIAGNOSIS — I10 Essential (primary) hypertension: Secondary | ICD-10-CM | POA: Diagnosis not present

## 2021-03-12 DIAGNOSIS — R609 Edema, unspecified: Secondary | ICD-10-CM | POA: Diagnosis not present

## 2021-03-12 DIAGNOSIS — R809 Proteinuria, unspecified: Secondary | ICD-10-CM | POA: Diagnosis not present

## 2021-03-21 DIAGNOSIS — M5441 Lumbago with sciatica, right side: Secondary | ICD-10-CM | POA: Diagnosis not present

## 2021-03-21 DIAGNOSIS — M5126 Other intervertebral disc displacement, lumbar region: Secondary | ICD-10-CM | POA: Diagnosis not present

## 2021-03-21 DIAGNOSIS — M5136 Other intervertebral disc degeneration, lumbar region: Secondary | ICD-10-CM | POA: Diagnosis not present

## 2021-03-21 DIAGNOSIS — G8929 Other chronic pain: Secondary | ICD-10-CM | POA: Diagnosis not present

## 2021-03-21 DIAGNOSIS — M4726 Other spondylosis with radiculopathy, lumbar region: Secondary | ICD-10-CM | POA: Diagnosis not present

## 2021-03-21 DIAGNOSIS — M5416 Radiculopathy, lumbar region: Secondary | ICD-10-CM | POA: Diagnosis not present

## 2021-03-22 DIAGNOSIS — N1832 Chronic kidney disease, stage 3b: Secondary | ICD-10-CM | POA: Diagnosis not present

## 2021-03-22 DIAGNOSIS — Q600 Renal agenesis, unilateral: Secondary | ICD-10-CM | POA: Diagnosis not present

## 2021-03-22 DIAGNOSIS — D631 Anemia in chronic kidney disease: Secondary | ICD-10-CM | POA: Diagnosis not present

## 2021-03-22 DIAGNOSIS — R809 Proteinuria, unspecified: Secondary | ICD-10-CM | POA: Diagnosis not present

## 2021-03-22 DIAGNOSIS — I10 Essential (primary) hypertension: Secondary | ICD-10-CM | POA: Diagnosis not present

## 2021-03-22 DIAGNOSIS — R609 Edema, unspecified: Secondary | ICD-10-CM | POA: Diagnosis not present

## 2021-03-22 DIAGNOSIS — R8290 Unspecified abnormal findings in urine: Secondary | ICD-10-CM | POA: Diagnosis not present

## 2021-03-22 DIAGNOSIS — N2581 Secondary hyperparathyroidism of renal origin: Secondary | ICD-10-CM | POA: Diagnosis not present

## 2021-04-11 DIAGNOSIS — M5416 Radiculopathy, lumbar region: Secondary | ICD-10-CM | POA: Diagnosis not present

## 2021-04-11 DIAGNOSIS — M5126 Other intervertebral disc displacement, lumbar region: Secondary | ICD-10-CM | POA: Diagnosis not present

## 2021-05-14 DIAGNOSIS — N1832 Chronic kidney disease, stage 3b: Secondary | ICD-10-CM | POA: Diagnosis not present

## 2021-05-14 DIAGNOSIS — R809 Proteinuria, unspecified: Secondary | ICD-10-CM | POA: Diagnosis not present

## 2021-05-14 DIAGNOSIS — R8290 Unspecified abnormal findings in urine: Secondary | ICD-10-CM | POA: Diagnosis not present

## 2021-05-14 DIAGNOSIS — N2581 Secondary hyperparathyroidism of renal origin: Secondary | ICD-10-CM | POA: Diagnosis not present

## 2021-05-14 DIAGNOSIS — D631 Anemia in chronic kidney disease: Secondary | ICD-10-CM | POA: Diagnosis not present

## 2021-05-14 DIAGNOSIS — I10 Essential (primary) hypertension: Secondary | ICD-10-CM | POA: Diagnosis not present

## 2021-05-14 DIAGNOSIS — Q600 Renal agenesis, unilateral: Secondary | ICD-10-CM | POA: Diagnosis not present

## 2021-05-14 DIAGNOSIS — R609 Edema, unspecified: Secondary | ICD-10-CM | POA: Diagnosis not present

## 2021-05-23 DIAGNOSIS — R609 Edema, unspecified: Secondary | ICD-10-CM | POA: Diagnosis not present

## 2021-05-23 DIAGNOSIS — Q600 Renal agenesis, unilateral: Secondary | ICD-10-CM | POA: Diagnosis not present

## 2021-05-23 DIAGNOSIS — N2581 Secondary hyperparathyroidism of renal origin: Secondary | ICD-10-CM | POA: Diagnosis not present

## 2021-05-23 DIAGNOSIS — D631 Anemia in chronic kidney disease: Secondary | ICD-10-CM | POA: Diagnosis not present

## 2021-05-23 DIAGNOSIS — N1832 Chronic kidney disease, stage 3b: Secondary | ICD-10-CM | POA: Diagnosis not present

## 2021-05-23 DIAGNOSIS — R809 Proteinuria, unspecified: Secondary | ICD-10-CM | POA: Diagnosis not present

## 2021-05-23 DIAGNOSIS — R8290 Unspecified abnormal findings in urine: Secondary | ICD-10-CM | POA: Diagnosis not present

## 2021-05-23 DIAGNOSIS — I10 Essential (primary) hypertension: Secondary | ICD-10-CM | POA: Diagnosis not present

## 2021-06-13 DIAGNOSIS — I10 Essential (primary) hypertension: Secondary | ICD-10-CM | POA: Diagnosis not present

## 2021-06-13 DIAGNOSIS — N184 Chronic kidney disease, stage 4 (severe): Secondary | ICD-10-CM | POA: Diagnosis not present

## 2021-06-13 DIAGNOSIS — N2581 Secondary hyperparathyroidism of renal origin: Secondary | ICD-10-CM | POA: Diagnosis not present

## 2021-06-20 DIAGNOSIS — I129 Hypertensive chronic kidney disease with stage 1 through stage 4 chronic kidney disease, or unspecified chronic kidney disease: Secondary | ICD-10-CM | POA: Diagnosis not present

## 2021-06-20 DIAGNOSIS — F325 Major depressive disorder, single episode, in full remission: Secondary | ICD-10-CM | POA: Diagnosis not present

## 2021-06-20 DIAGNOSIS — N2581 Secondary hyperparathyroidism of renal origin: Secondary | ICD-10-CM | POA: Diagnosis not present

## 2021-06-20 DIAGNOSIS — Z87891 Personal history of nicotine dependence: Secondary | ICD-10-CM | POA: Diagnosis not present

## 2021-06-20 DIAGNOSIS — N184 Chronic kidney disease, stage 4 (severe): Secondary | ICD-10-CM | POA: Diagnosis not present

## 2021-06-20 DIAGNOSIS — E538 Deficiency of other specified B group vitamins: Secondary | ICD-10-CM | POA: Diagnosis not present

## 2021-06-20 DIAGNOSIS — I700 Atherosclerosis of aorta: Secondary | ICD-10-CM | POA: Diagnosis not present

## 2021-07-18 DIAGNOSIS — M5416 Radiculopathy, lumbar region: Secondary | ICD-10-CM | POA: Diagnosis not present

## 2021-07-18 DIAGNOSIS — M5126 Other intervertebral disc displacement, lumbar region: Secondary | ICD-10-CM | POA: Diagnosis not present

## 2021-08-07 DIAGNOSIS — R8290 Unspecified abnormal findings in urine: Secondary | ICD-10-CM | POA: Diagnosis not present

## 2021-08-07 DIAGNOSIS — Q600 Renal agenesis, unilateral: Secondary | ICD-10-CM | POA: Diagnosis not present

## 2021-08-07 DIAGNOSIS — N2581 Secondary hyperparathyroidism of renal origin: Secondary | ICD-10-CM | POA: Diagnosis not present

## 2021-08-07 DIAGNOSIS — N1832 Chronic kidney disease, stage 3b: Secondary | ICD-10-CM | POA: Diagnosis not present

## 2021-08-13 DIAGNOSIS — Q600 Renal agenesis, unilateral: Secondary | ICD-10-CM | POA: Diagnosis not present

## 2021-08-13 DIAGNOSIS — N2581 Secondary hyperparathyroidism of renal origin: Secondary | ICD-10-CM | POA: Diagnosis not present

## 2021-08-13 DIAGNOSIS — N1832 Chronic kidney disease, stage 3b: Secondary | ICD-10-CM | POA: Diagnosis not present

## 2021-08-13 DIAGNOSIS — R8290 Unspecified abnormal findings in urine: Secondary | ICD-10-CM | POA: Diagnosis not present

## 2021-08-23 DIAGNOSIS — Q600 Renal agenesis, unilateral: Secondary | ICD-10-CM | POA: Diagnosis not present

## 2021-08-23 DIAGNOSIS — I10 Essential (primary) hypertension: Secondary | ICD-10-CM | POA: Diagnosis not present

## 2021-08-23 DIAGNOSIS — R809 Proteinuria, unspecified: Secondary | ICD-10-CM | POA: Diagnosis not present

## 2021-08-23 DIAGNOSIS — N1832 Chronic kidney disease, stage 3b: Secondary | ICD-10-CM | POA: Diagnosis not present

## 2021-08-23 DIAGNOSIS — R609 Edema, unspecified: Secondary | ICD-10-CM | POA: Diagnosis not present

## 2021-08-23 DIAGNOSIS — N2581 Secondary hyperparathyroidism of renal origin: Secondary | ICD-10-CM | POA: Diagnosis not present

## 2021-08-23 DIAGNOSIS — D631 Anemia in chronic kidney disease: Secondary | ICD-10-CM | POA: Diagnosis not present

## 2021-08-24 DIAGNOSIS — R8290 Unspecified abnormal findings in urine: Secondary | ICD-10-CM | POA: Diagnosis not present

## 2021-08-24 DIAGNOSIS — R399 Unspecified symptoms and signs involving the genitourinary system: Secondary | ICD-10-CM | POA: Diagnosis not present

## 2021-08-30 DIAGNOSIS — H35341 Macular cyst, hole, or pseudohole, right eye: Secondary | ICD-10-CM | POA: Diagnosis not present

## 2021-09-26 DIAGNOSIS — H2511 Age-related nuclear cataract, right eye: Secondary | ICD-10-CM | POA: Diagnosis not present

## 2021-10-08 ENCOUNTER — Encounter: Payer: Self-pay | Admitting: Ophthalmology

## 2021-10-15 NOTE — Discharge Instructions (Signed)

## 2021-10-16 ENCOUNTER — Encounter
Admission: RE | Disposition: A | Discharge status: Home / Self Care | Payer: Self-pay | Source: Home / Self Care | Attending: Ophthalmology

## 2021-10-16 ENCOUNTER — Encounter: Payer: Self-pay | Admitting: Ophthalmology

## 2021-10-16 ENCOUNTER — Ambulatory Visit: Payer: Medicare HMO | Admitting: Anesthesiology

## 2021-10-16 ENCOUNTER — Ambulatory Visit
Admission: RE | Admit: 2021-10-16 | Discharge: 2021-10-16 | Disposition: A | Discharge status: Home / Self Care | Payer: Medicare HMO | Attending: Ophthalmology | Admitting: Ophthalmology

## 2021-10-16 ENCOUNTER — Other Ambulatory Visit: Payer: Self-pay

## 2021-10-16 DIAGNOSIS — I129 Hypertensive chronic kidney disease with stage 1 through stage 4 chronic kidney disease, or unspecified chronic kidney disease: Secondary | ICD-10-CM | POA: Insufficient documentation

## 2021-10-16 DIAGNOSIS — H25811 Combined forms of age-related cataract, right eye: Secondary | ICD-10-CM | POA: Diagnosis not present

## 2021-10-16 DIAGNOSIS — H2511 Age-related nuclear cataract, right eye: Secondary | ICD-10-CM | POA: Insufficient documentation

## 2021-10-16 DIAGNOSIS — K219 Gastro-esophageal reflux disease without esophagitis: Secondary | ICD-10-CM | POA: Diagnosis not present

## 2021-10-16 DIAGNOSIS — J45909 Unspecified asthma, uncomplicated: Secondary | ICD-10-CM | POA: Insufficient documentation

## 2021-10-16 DIAGNOSIS — N184 Chronic kidney disease, stage 4 (severe): Secondary | ICD-10-CM | POA: Insufficient documentation

## 2021-10-16 HISTORY — DX: Polyneuropathy, unspecified: G62.9

## 2021-10-16 HISTORY — PX: CATARACT EXTRACTION W/PHACO: SHX586

## 2021-10-16 SURGERY — PHACOEMULSIFICATION, CATARACT, WITH IOL INSERTION
Anesthesia: Monitor Anesthesia Care | Site: Eye | Laterality: Right

## 2021-10-16 MED ORDER — LACTATED RINGERS IV SOLN: INTRAVENOUS | Status: DC

## 2021-10-16 MED ORDER — BRIMONIDINE TARTRATE-TIMOLOL 0.2-0.5 % OP SOLN
OPHTHALMIC | Status: DC | PRN
  Administered 2021-10-16: 1 [drp] via OPHTHALMIC

## 2021-10-16 MED ORDER — MIDAZOLAM HCL 2 MG/2ML IJ SOLN
INTRAMUSCULAR | Status: DC | PRN
Start: 2021-10-16 — End: 2021-10-16
  Administered 2021-10-16: 1 mg via INTRAVENOUS

## 2021-10-16 MED ORDER — FENTANYL CITRATE (PF) 100 MCG/2ML IJ SOLN
INTRAMUSCULAR | Status: DC | PRN
  Administered 2021-10-16: 50 ug via INTRAVENOUS

## 2021-10-16 MED ORDER — ACETAMINOPHEN 160 MG/5ML PO SOLN: 325.0000 mg | Freq: Once | ORAL | Status: DC

## 2021-10-16 MED ORDER — TETRACAINE HCL 0.5 % OP SOLN
1.0000 [drp] | OPHTHALMIC | Status: DC | PRN
  Administered 2021-10-16 (×3): 1 [drp] via OPHTHALMIC

## 2021-10-16 MED ORDER — SIGHTPATH DOSE#1 NA CHONDROIT SULF-NA HYALURON 40-17 MG/ML IO SOLN
INTRAOCULAR | Status: DC | PRN
  Administered 2021-10-16: 1 mL via INTRAOCULAR

## 2021-10-16 MED ORDER — SIGHTPATH DOSE#1 BSS IO SOLN
INTRAOCULAR | Status: DC | PRN
  Administered 2021-10-16: 70 mL via OPHTHALMIC

## 2021-10-16 MED ORDER — SIGHTPATH DOSE#1 BSS IO SOLN
INTRAOCULAR | Status: DC | PRN
  Administered 2021-10-16: 15 mL

## 2021-10-16 MED ORDER — ARMC OPHTHALMIC DILATING DROPS
1.0000 "application " | OPHTHALMIC | Status: DC | PRN
  Administered 2021-10-16 (×3): 1 via OPHTHALMIC

## 2021-10-16 MED ORDER — MOXIFLOXACIN HCL 0.5 % OP SOLN
OPHTHALMIC | Status: DC | PRN
  Administered 2021-10-16: 0.2 mL via OPHTHALMIC

## 2021-10-16 MED ORDER — SIGHTPATH DOSE#1 BSS IO SOLN
INTRAOCULAR | Status: DC | PRN
  Administered 2021-10-16: 1 mL via INTRAMUSCULAR

## 2021-10-16 MED ORDER — ACETAMINOPHEN 325 MG PO TABS: 325.0000 mg | ORAL_TABLET | Freq: Once | ORAL | Status: DC

## 2021-10-16 SURGICAL SUPPLY — 10 items
CATARACT SUITE SIGHTPATH (MISCELLANEOUS) ×2 IMPLANT
FEE CATARACT SUITE SIGHTPATH (MISCELLANEOUS) ×1 IMPLANT
GLOVE SURG ENC TEXT LTX SZ8 (GLOVE) ×2 IMPLANT
GLOVE SURG TRIUMPH 8.0 PF LTX (GLOVE) ×2 IMPLANT
LENS IOL TECNIS EYHANCE 23.0 (Intraocular Lens) ×1 IMPLANT
NDL FILTER BLUNT 18X1 1/2 (NEEDLE) ×1 IMPLANT
NEEDLE FILTER BLUNT 18X 1/2SAF (NEEDLE) ×1
NEEDLE FILTER BLUNT 18X1 1/2 (NEEDLE) ×1 IMPLANT
SYR 3ML LL SCALE MARK (SYRINGE) ×2 IMPLANT
WATER STERILE IRR 250ML POUR (IV SOLUTION) ×2 IMPLANT

## 2021-10-16 NOTE — Op Note (Signed)
PREOPERATIVE DIAGNOSIS:  Nuclear sclerotic cataract of the right eye.   POSTOPERATIVE DIAGNOSIS:  H25.11 Cataract   OPERATIVE PROCEDURE:ORPROCALL@   SURGEON:  Birder Robson, MD.   ANESTHESIA:  Anesthesiologist: Ronelle Nigh, MD CRNA: Dionne Bucy, CRNA  1.      Managed anesthesia care. 2.      0.2m of Shugarcaine was instilled in the eye following the paracentesis.   COMPLICATIONS:  None.   TECHNIQUE:   Stop and chop   DESCRIPTION OF PROCEDURE:  The patient was examined and consented in the preoperative holding area where the aforementioned topical anesthesia was applied to the right eye and then brought back to the Operating Room where the right eye was prepped and draped in the usual sterile ophthalmic fashion and a lid speculum was placed. A paracentesis was created with the side port blade and the anterior chamber was filled with viscoelastic. A near clear corneal incision was performed with the steel keratome. A continuous curvilinear capsulorrhexis was performed with a cystotome followed by the capsulorrhexis forceps. Hydrodissection and hydrodelineation were carried out with BSS on a blunt cannula. The lens was removed in a stop and chop  technique and the remaining cortical material was removed with the irrigation-aspiration handpiece. The capsular bag was inflated with viscoelastic and the Technis [id number]  lens was placed in the capsular bag without complication. The remaining viscoelastic was removed from the eye with the irrigation-aspiration handpiece. The wounds were hydrated. The anterior chamber was flushed with BSS and the eye was inflated to physiologic pressure. 0.152mof Vigamox was placed in the anterior chamber. The wounds were found to be water tight. The eye was dressed with Combigan. The patient was given protective glasses to wear throughout the day and a shield with which to sleep tonight. The patient was also given drops with which to begin a drop regimen today  and will follow-up with me in one day. Implant Name Type Inv. Item Serial No. Manufacturer Lot No. LRB No. Used Action  LENS IOL TECNIS EYHANCE 23.0 - [id number] Lens LENS IOL TECNIS EYHANCE 23.0 [id number]  Right 1 Implanted   Procedure(s): CATARACT EXTRACTION PHACO AND INTRAOCULAR LENS PLACEMENT (IOC) RIGHT 4.63 00:36.0 (Right)  Electronically signed: WiBirder Robson/13/2023 12:21 PM

## 2021-10-16 NOTE — Transfer of Care (Signed)
Immediate Anesthesia Transfer of Care Note  Patient: Amy Vazquez  Procedure(s) Performed: CATARACT EXTRACTION PHACO AND INTRAOCULAR LENS PLACEMENT (IOC) RIGHT 4.63 00:36.0 (Right: Eye)  Patient Location: PACU  Anesthesia Type: MAC  Level of Consciousness: awake, alert  and patient cooperative  Airway and Oxygen Therapy: Patient Spontanous Breathing and Patient connected to supplemental oxygen  Post-op Assessment: Post-op Vital signs reviewed, Patient's Cardiovascular Status Stable, Respiratory Function Stable, Patent Airway and No signs of Nausea or vomiting  Post-op Vital Signs: Reviewed and stable  Complications: No notable events documented.

## 2021-10-16 NOTE — H&P (Signed)
[facility]   Primary Care Physician:  Kirk Ruths, MD Ophthalmologist: Dr. George Ina  Pre-Procedure History & Physical: HPI:  Amy Vazquez is a 76 y.o. female here for cataract surgery.   Past Medical History:  Diagnosis Date   Anemia    Arthritis    FINGERS   Asthma    WELL CONTROLLED   Cervical disc disease    Chronic kidney disease    Stage 4   Depression    GERD (gastroesophageal reflux disease)    RARE   Headache    H/O MIGRAINES   Hypertension    Neuropathy    feet    Past Surgical History:  Procedure Laterality Date   ABDOMINAL HYSTERECTOMY     ANTERIOR CERVICAL DECOMP/DISCECTOMY FUSION N/A 01/24/2020   Procedure: ANTERIOR CERVICAL DECOMPRESSION/DISCECTOMY FUSION 2 LEVELS C2-4;  Surgeon: Deetta Perla, MD;  Location: [facility] ORS;  Service: Neurosurgery;  Laterality: N/A;   CARPAL TUNNEL RELEASE Right    CHOLECYSTECTOMY     COLONOSCOPY     COLONOSCOPY WITH PROPOFOL N/A 06/24/2016   Procedure: COLONOSCOPY WITH PROPOFOL;  Surgeon: Lollie Sails, MD;  Location: [facility] ENDOSCOPY;  Service: Endoscopy;  Laterality: N/A;   NECK SURGERY     FUSION    WISDOM TOOTH EXTRACTION      Prior to Admission medications   Medication Sig Start Date End Date Taking? Authorizing Provider  acetaminophen (TYLENOL) 500 MG tablet Take 2 tablets (1,000 mg total) by mouth every 8 (eight) hours. 01/25/20  Yes Zdeb, Altha Harm, NP  buPROPion (WELLBUTRIN SR) 150 MG 12 hr tablet Take 150 mg by mouth 2 (two) times daily. 11/23/19  Yes [provider]  gabapentin (NEURONTIN) 300 MG capsule Take 300 mg by mouth.   Yes [provider]  lisinopril-hydrochlorothiazide (PRINZIDE,ZESTORETIC) 20-25 MG tablet Take 1 tablet by mouth every evening.    Yes [provider]  pravastatin (PRAVACHOL) 40 MG tablet Take 40 mg by mouth at bedtime.    Yes [provider]  sertraline (ZOLOFT) 100 MG tablet Take 100 mg by mouth every evening.    Yes [provider]  verapamil (CALAN-SR) 180 MG CR tablet Take 180 mg by mouth at bedtime.   Yes [provider]  Grant Ruts INHUB 250-50 MCG/DOSE AEPB Inhale 1 puff into the lungs every 12 (twelve) hours. 09/02/19  Yes [provider]  Ascorbic Acid (VITAMIN C PO) Take 1 tablet by mouth daily. Patient not taking: Reported on 10/08/2021    [provider]  Cholecalciferol 25 MCG (1000 UT) tablet Take 1,000 Units by mouth daily. Patient not taking: Reported on 10/08/2021    [provider]  cholestyramine (QUESTRAN) 4 g packet Take 1 packet by mouth 2 (two) times daily as needed (diarrhea).  Patient not taking: Reported on 01/24/2020 12/16/19   [provider]  cyanocobalamin 1000 MCG tablet Take 1,000 mcg by mouth daily. Patient not taking: Reported on 10/08/2021    [provider]  ferrous sulfate 325 (65 FE) MG tablet Take 325 mg by mouth daily. Patient not taking: Reported on 10/08/2021    [provider]    Allergies as of 09/03/2021   (No Known Allergies)    Family History  Problem Relation Age of Onset   Breast cancer Neg Hx     Social History   Socioeconomic History   Marital status: Married    Spouse name: Not on file   Number of children: Not on file  Years of education: Not on file   Highest education level: Not on file  Occupational History   Not on file  Tobacco Use   Smoking status: Former    Packs/day: 0.25    Years: 10.00    Total pack years: 2.50    Types: Cigarettes    Quit date: 05/07/2015    Years since quitting: 6.4   Smokeless tobacco: Never  Vaping Use   Vaping Use: Never used  Substance and Sexual Activity   Alcohol use: No   Drug use: No   Sexual activity: Not on file  Other Topics Concern   Not on file  Social History Narrative   Not on file   Social Determinants of Health   Financial Resource Strain: Not on file  Food Insecurity: Not on file  Transportation Needs: Not on file  Physical  Activity: Not on file  Stress: Not on file  Social Connections: Not on file  Intimate Partner Violence: Not on file    Review of Systems: See HPI, otherwise negative ROS  Physical Exam: BP (!) 160/81   Pulse 71   Temp (!) 97.5 F (36.4 C) (Temporal)   Ht '5\' 3"'$  (1.6 m)   Wt 69.8 kg   SpO2 97%   BMI 27.24 kg/m  General:   Alert, cooperative in NAD Head:  Normocephalic and atraumatic. Respiratory:  Normal work of breathing. Cardiovascular:  RRR  Impression/Plan: Amy Vazquez is here for cataract surgery.  Risks, benefits, limitations, and alternatives regarding cataract surgery have been reviewed with the patient.  Questions have been answered.  All parties agreeable.   Birder Robson, MD  10/16/2021, 11:53 AM

## 2021-10-16 NOTE — Anesthesia Preprocedure Evaluation (Signed)
Anesthesia Evaluation  Patient identified by MRN, date of birth, ID band Patient awake    Reviewed: Allergy & Precautions, H&P , NPO status , Patient's Chart, lab work & pertinent test results  Airway Mallampati: II  TM Distance: >3 FB Neck ROM: full    Dental no notable dental hx.    Pulmonary asthma , former smoker,    Pulmonary exam normal breath sounds clear to auscultation       Cardiovascular hypertension, Normal cardiovascular exam Rhythm:regular Rate:Normal     Neuro/Psych    GI/Hepatic GERD  ,  Endo/Other    Renal/GU Renal disease     Musculoskeletal   Abdominal   Peds  Hematology   Anesthesia Other Findings   Reproductive/Obstetrics                             Anesthesia Physical Anesthesia Plan  ASA: 2  Anesthesia Plan: MAC   Post-op Pain Management: Minimal or no pain anticipated   Induction:   PONV Risk Score and Plan: 2 and Treatment may vary due to age or medical condition, Midazolam and TIVA  Airway Management Planned:   Additional Equipment:   Intra-op Plan:   Post-operative Plan:   Informed Consent: I have reviewed the patients History and Physical, chart, labs and discussed the procedure including the risks, benefits and alternatives for the proposed anesthesia with the patient or authorized representative who has indicated his/her understanding and acceptance.     Dental Advisory Given  Plan Discussed with: CRNA  Anesthesia Plan Comments:         Anesthesia Quick Evaluation

## 2021-10-16 NOTE — Anesthesia Postprocedure Evaluation (Signed)
Anesthesia Post Note  Patient: Amy Vazquez  Procedure(s) Performed: CATARACT EXTRACTION PHACO AND INTRAOCULAR LENS PLACEMENT (IOC) RIGHT 4.63 00:36.0 (Right: Eye)     Patient location during evaluation: PACU Anesthesia Type: MAC Level of consciousness: awake and alert and oriented Pain management: satisfactory to patient Vital Signs Assessment: post-procedure vital signs reviewed and stable Respiratory status: spontaneous breathing, nonlabored ventilation and respiratory function stable Cardiovascular status: blood pressure returned to baseline and stable Postop Assessment: Adequate PO intake and No signs of nausea or vomiting Anesthetic complications: no   No notable events documented.  Raliegh Ip

## 2021-10-16 NOTE — Anesthesia Procedure Notes (Signed)
Procedure Name: MAC Date/Time: 10/16/2021 12:01 PM  Performed by: Dionne Bucy, CRNAPre-anesthesia Checklist: Patient identified, Emergency Drugs available, Suction available, Patient being monitored and Timeout performed Patient Re-evaluated:Patient Re-evaluated prior to induction Oxygen Delivery Method: Nasal cannula Placement Confirmation: positive ETCO2

## 2021-10-17 ENCOUNTER — Encounter: Payer: Self-pay | Admitting: Ophthalmology

## 2021-10-17 DIAGNOSIS — R809 Proteinuria, unspecified: Secondary | ICD-10-CM | POA: Diagnosis not present

## 2021-10-17 DIAGNOSIS — Q600 Renal agenesis, unilateral: Secondary | ICD-10-CM | POA: Diagnosis not present

## 2021-10-17 DIAGNOSIS — D631 Anemia in chronic kidney disease: Secondary | ICD-10-CM | POA: Diagnosis not present

## 2021-10-17 DIAGNOSIS — I10 Essential (primary) hypertension: Secondary | ICD-10-CM | POA: Diagnosis not present

## 2021-10-17 DIAGNOSIS — R609 Edema, unspecified: Secondary | ICD-10-CM | POA: Diagnosis not present

## 2021-10-17 DIAGNOSIS — N2581 Secondary hyperparathyroidism of renal origin: Secondary | ICD-10-CM | POA: Diagnosis not present

## 2021-10-17 DIAGNOSIS — N1832 Chronic kidney disease, stage 3b: Secondary | ICD-10-CM | POA: Diagnosis not present

## 2021-10-25 DIAGNOSIS — N184 Chronic kidney disease, stage 4 (severe): Secondary | ICD-10-CM | POA: Diagnosis not present

## 2021-10-25 DIAGNOSIS — I10 Essential (primary) hypertension: Secondary | ICD-10-CM | POA: Diagnosis not present

## 2021-10-25 DIAGNOSIS — Q600 Renal agenesis, unilateral: Secondary | ICD-10-CM | POA: Diagnosis not present

## 2021-10-25 DIAGNOSIS — N2581 Secondary hyperparathyroidism of renal origin: Secondary | ICD-10-CM | POA: Diagnosis not present

## 2021-10-25 DIAGNOSIS — R809 Proteinuria, unspecified: Secondary | ICD-10-CM | POA: Diagnosis not present

## 2021-10-25 DIAGNOSIS — R609 Edema, unspecified: Secondary | ICD-10-CM | POA: Diagnosis not present

## 2021-10-25 DIAGNOSIS — D631 Anemia in chronic kidney disease: Secondary | ICD-10-CM | POA: Diagnosis not present

## 2021-11-28 DIAGNOSIS — Z961 Presence of intraocular lens: Secondary | ICD-10-CM | POA: Diagnosis not present

## 2021-12-13 DIAGNOSIS — N184 Chronic kidney disease, stage 4 (severe): Secondary | ICD-10-CM | POA: Diagnosis not present

## 2021-12-13 DIAGNOSIS — E538 Deficiency of other specified B group vitamins: Secondary | ICD-10-CM | POA: Diagnosis not present

## 2021-12-13 DIAGNOSIS — I10 Essential (primary) hypertension: Secondary | ICD-10-CM | POA: Diagnosis not present

## 2021-12-13 DIAGNOSIS — Z79899 Other long term (current) drug therapy: Secondary | ICD-10-CM | POA: Diagnosis not present

## 2021-12-20 DIAGNOSIS — F325 Major depressive disorder, single episode, in full remission: Secondary | ICD-10-CM | POA: Diagnosis not present

## 2021-12-20 DIAGNOSIS — I700 Atherosclerosis of aorta: Secondary | ICD-10-CM | POA: Diagnosis not present

## 2021-12-20 DIAGNOSIS — I129 Hypertensive chronic kidney disease with stage 1 through stage 4 chronic kidney disease, or unspecified chronic kidney disease: Secondary | ICD-10-CM | POA: Diagnosis not present

## 2021-12-20 DIAGNOSIS — Z1389 Encounter for screening for other disorder: Secondary | ICD-10-CM | POA: Diagnosis not present

## 2021-12-20 DIAGNOSIS — Z0000 Encounter for general adult medical examination without abnormal findings: Secondary | ICD-10-CM | POA: Diagnosis not present

## 2021-12-20 DIAGNOSIS — Z5320 Procedure and treatment not carried out because of patient's decision for unspecified reasons: Secondary | ICD-10-CM | POA: Diagnosis not present

## 2021-12-20 DIAGNOSIS — N184 Chronic kidney disease, stage 4 (severe): Secondary | ICD-10-CM | POA: Diagnosis not present

## 2022-01-22 DIAGNOSIS — D631 Anemia in chronic kidney disease: Secondary | ICD-10-CM | POA: Diagnosis not present

## 2022-01-22 DIAGNOSIS — R609 Edema, unspecified: Secondary | ICD-10-CM | POA: Diagnosis not present

## 2022-01-22 DIAGNOSIS — N184 Chronic kidney disease, stage 4 (severe): Secondary | ICD-10-CM | POA: Diagnosis not present

## 2022-01-22 DIAGNOSIS — I10 Essential (primary) hypertension: Secondary | ICD-10-CM | POA: Diagnosis not present

## 2022-01-22 DIAGNOSIS — Q600 Renal agenesis, unilateral: Secondary | ICD-10-CM | POA: Diagnosis not present

## 2022-01-22 DIAGNOSIS — R809 Proteinuria, unspecified: Secondary | ICD-10-CM | POA: Diagnosis not present

## 2022-01-22 DIAGNOSIS — N2581 Secondary hyperparathyroidism of renal origin: Secondary | ICD-10-CM | POA: Diagnosis not present

## 2022-01-29 DIAGNOSIS — I10 Essential (primary) hypertension: Secondary | ICD-10-CM | POA: Diagnosis not present

## 2022-01-29 DIAGNOSIS — N184 Chronic kidney disease, stage 4 (severe): Secondary | ICD-10-CM | POA: Diagnosis not present

## 2022-01-29 DIAGNOSIS — R809 Proteinuria, unspecified: Secondary | ICD-10-CM | POA: Diagnosis not present

## 2022-01-29 DIAGNOSIS — Q600 Renal agenesis, unilateral: Secondary | ICD-10-CM | POA: Diagnosis not present

## 2022-01-29 DIAGNOSIS — R609 Edema, unspecified: Secondary | ICD-10-CM | POA: Diagnosis not present

## 2022-01-29 DIAGNOSIS — N2581 Secondary hyperparathyroidism of renal origin: Secondary | ICD-10-CM | POA: Diagnosis not present

## 2022-01-29 DIAGNOSIS — D631 Anemia in chronic kidney disease: Secondary | ICD-10-CM | POA: Diagnosis not present

## 2022-02-26 DIAGNOSIS — M5416 Radiculopathy, lumbar region: Secondary | ICD-10-CM | POA: Diagnosis not present

## 2022-02-26 DIAGNOSIS — M5126 Other intervertebral disc displacement, lumbar region: Secondary | ICD-10-CM | POA: Diagnosis not present

## 2022-03-01 DIAGNOSIS — R768 Other specified abnormal immunological findings in serum: Secondary | ICD-10-CM | POA: Diagnosis not present

## 2022-03-01 DIAGNOSIS — M159 Polyosteoarthritis, unspecified: Secondary | ICD-10-CM | POA: Diagnosis not present

## 2022-03-08 DIAGNOSIS — R194 Change in bowel habit: Secondary | ICD-10-CM | POA: Diagnosis not present

## 2022-03-08 DIAGNOSIS — Z86010 Personal history of colonic polyps: Secondary | ICD-10-CM | POA: Diagnosis not present

## 2022-03-20 DIAGNOSIS — R200 Anesthesia of skin: Secondary | ICD-10-CM | POA: Diagnosis not present

## 2022-03-20 DIAGNOSIS — G5603 Carpal tunnel syndrome, bilateral upper limbs: Secondary | ICD-10-CM | POA: Diagnosis not present

## 2022-03-20 DIAGNOSIS — E569 Vitamin deficiency, unspecified: Secondary | ICD-10-CM | POA: Diagnosis not present

## 2022-03-20 DIAGNOSIS — E538 Deficiency of other specified B group vitamins: Secondary | ICD-10-CM | POA: Diagnosis not present

## 2022-03-20 DIAGNOSIS — R202 Paresthesia of skin: Secondary | ICD-10-CM | POA: Diagnosis not present

## 2022-03-25 DIAGNOSIS — M545 Low back pain, unspecified: Secondary | ICD-10-CM | POA: Diagnosis not present

## 2022-03-25 DIAGNOSIS — M5136 Other intervertebral disc degeneration, lumbar region: Secondary | ICD-10-CM | POA: Diagnosis not present

## 2022-03-25 DIAGNOSIS — M47816 Spondylosis without myelopathy or radiculopathy, lumbar region: Secondary | ICD-10-CM | POA: Diagnosis not present

## 2022-04-30 DIAGNOSIS — Q600 Renal agenesis, unilateral: Secondary | ICD-10-CM | POA: Diagnosis not present

## 2022-04-30 DIAGNOSIS — I129 Hypertensive chronic kidney disease with stage 1 through stage 4 chronic kidney disease, or unspecified chronic kidney disease: Secondary | ICD-10-CM | POA: Diagnosis not present

## 2022-04-30 DIAGNOSIS — R809 Proteinuria, unspecified: Secondary | ICD-10-CM | POA: Diagnosis not present

## 2022-04-30 DIAGNOSIS — N2581 Secondary hyperparathyroidism of renal origin: Secondary | ICD-10-CM | POA: Diagnosis not present

## 2022-04-30 DIAGNOSIS — D631 Anemia in chronic kidney disease: Secondary | ICD-10-CM | POA: Diagnosis not present

## 2022-04-30 DIAGNOSIS — N184 Chronic kidney disease, stage 4 (severe): Secondary | ICD-10-CM | POA: Diagnosis not present

## 2022-04-30 DIAGNOSIS — I10 Essential (primary) hypertension: Secondary | ICD-10-CM | POA: Diagnosis not present

## 2022-04-30 DIAGNOSIS — R609 Edema, unspecified: Secondary | ICD-10-CM | POA: Diagnosis not present

## 2022-05-13 DIAGNOSIS — M47816 Spondylosis without myelopathy or radiculopathy, lumbar region: Secondary | ICD-10-CM | POA: Diagnosis not present

## 2022-05-29 DIAGNOSIS — M47816 Spondylosis without myelopathy or radiculopathy, lumbar region: Secondary | ICD-10-CM | POA: Diagnosis not present

## 2022-05-30 DIAGNOSIS — H35341 Macular cyst, hole, or pseudohole, right eye: Secondary | ICD-10-CM | POA: Diagnosis not present

## 2022-05-30 DIAGNOSIS — H2512 Age-related nuclear cataract, left eye: Secondary | ICD-10-CM | POA: Diagnosis not present

## 2022-05-30 DIAGNOSIS — D3131 Benign neoplasm of right choroid: Secondary | ICD-10-CM | POA: Diagnosis not present

## 2022-06-13 ENCOUNTER — Encounter: Payer: Self-pay | Admitting: *Deleted

## 2022-06-14 ENCOUNTER — Encounter
Admission: RE | Disposition: A | Discharge status: Home / Self Care | Payer: Self-pay | Source: Home / Self Care | Attending: Gastroenterology

## 2022-06-14 ENCOUNTER — Encounter: Payer: Self-pay | Admitting: *Deleted

## 2022-06-14 ENCOUNTER — Ambulatory Visit: Payer: Medicare HMO | Admitting: Anesthesiology

## 2022-06-14 ENCOUNTER — Ambulatory Visit
Admission: RE | Admit: 2022-06-14 | Discharge: 2022-06-14 | Disposition: A | Discharge status: Home / Self Care | Payer: Medicare HMO | Attending: Gastroenterology | Admitting: Gastroenterology

## 2022-06-14 DIAGNOSIS — Z1211 Encounter for screening for malignant neoplasm of colon: Secondary | ICD-10-CM | POA: Diagnosis not present

## 2022-06-14 DIAGNOSIS — K649 Unspecified hemorrhoids: Secondary | ICD-10-CM | POA: Diagnosis not present

## 2022-06-14 DIAGNOSIS — Z87891 Personal history of nicotine dependence: Secondary | ICD-10-CM | POA: Insufficient documentation

## 2022-06-14 DIAGNOSIS — F32A Depression, unspecified: Secondary | ICD-10-CM | POA: Diagnosis not present

## 2022-06-14 DIAGNOSIS — Z79899 Other long term (current) drug therapy: Secondary | ICD-10-CM | POA: Diagnosis not present

## 2022-06-14 DIAGNOSIS — K635 Polyp of colon: Secondary | ICD-10-CM | POA: Insufficient documentation

## 2022-06-14 DIAGNOSIS — J45909 Unspecified asthma, uncomplicated: Secondary | ICD-10-CM | POA: Diagnosis not present

## 2022-06-14 DIAGNOSIS — Z86010 Personal history of colonic polyps: Secondary | ICD-10-CM | POA: Diagnosis not present

## 2022-06-14 DIAGNOSIS — I129 Hypertensive chronic kidney disease with stage 1 through stage 4 chronic kidney disease, or unspecified chronic kidney disease: Secondary | ICD-10-CM | POA: Insufficient documentation

## 2022-06-14 DIAGNOSIS — E785 Hyperlipidemia, unspecified: Secondary | ICD-10-CM | POA: Insufficient documentation

## 2022-06-14 DIAGNOSIS — K219 Gastro-esophageal reflux disease without esophagitis: Secondary | ICD-10-CM | POA: Diagnosis not present

## 2022-06-14 DIAGNOSIS — D126 Benign neoplasm of colon, unspecified: Secondary | ICD-10-CM | POA: Diagnosis not present

## 2022-06-14 DIAGNOSIS — N184 Chronic kidney disease, stage 4 (severe): Secondary | ICD-10-CM | POA: Insufficient documentation

## 2022-06-14 DIAGNOSIS — K640 First degree hemorrhoids: Secondary | ICD-10-CM | POA: Insufficient documentation

## 2022-06-14 HISTORY — PX: COLONOSCOPY WITH PROPOFOL: SHX5780

## 2022-06-14 SURGERY — COLONOSCOPY WITH PROPOFOL
Anesthesia: General

## 2022-06-14 MED ORDER — PROPOFOL 500 MG/50ML IV EMUL
INTRAVENOUS | Status: DC | PRN
  Administered 2022-06-14: 50 mg via INTRAVENOUS
  Administered 2022-06-14: 150 ug/kg/min via INTRAVENOUS

## 2022-06-14 MED ORDER — SODIUM CHLORIDE 0.9 % IV SOLN
INTRAVENOUS | Status: DC
  Administered 2022-06-14: 1000 mL via INTRAVENOUS

## 2022-06-14 MED ORDER — LIDOCAINE HCL (CARDIAC) PF 100 MG/5ML IV SOSY
PREFILLED_SYRINGE | INTRAVENOUS | Status: DC | PRN
  Administered 2022-06-14: 100 mg via INTRAVENOUS

## 2022-06-14 MED ORDER — ALBUTEROL SULFATE HFA 108 (90 BASE) MCG/ACT IN AERS
INHALATION_SPRAY | RESPIRATORY_TRACT | Status: DC | PRN
  Administered 2022-06-14: 4 via RESPIRATORY_TRACT

## 2022-06-14 NOTE — Op Note (Signed)
[facility] Gastroenterology Patient Name: Amy Vazquez Procedure Date: 06/14/2022 8:52 AM MRN: [id number] Account #: [id number] Date of Birth: Jan 10, 1946 Admit Type: Outpatient Age: 77 Room: [facility] ENDO ROOM 3 Gender: Female Note Status: Finalized Instrument Name: Peds Colonoscope [id number] Procedure:             Colonoscopy Indications:           High risk colon cancer surveillance: Personal history                         of non-advanced adenoma, Last colonoscopy 5 years ago Providers:             Andrey Farmer MD, MD Referring MD:          Ocie Cornfield. Ouida Sills MD, MD (Referring MD) Medicines:             Monitored Anesthesia Care Complications:         No immediate complications. Estimated blood loss:                         Minimal. Procedure:             Pre-Anesthesia Assessment:                        - Prior to the procedure, a History and Physical was                         performed, and patient medications and allergies were                         reviewed. The patient is competent. The risks and                         benefits of the procedure and the sedation options and                         risks were discussed with the patient. All questions                         were answered and informed consent was obtained.                         Patient identification and proposed procedure were                         verified by the physician, the nurse, the                         anesthesiologist, the anesthetist and the technician                         in the endoscopy suite. Mental Status Examination:                         alert and oriented. Airway Examination: normal                         oropharyngeal airway and neck mobility. Respiratory  Examination: clear to auscultation. CV Examination:                         normal. Prophylactic Antibiotics: The patient does not                         require prophylactic  antibiotics. Prior                         Anticoagulants: The patient has taken no anticoagulant                         or antiplatelet agents. ASA Grade Assessment: II - A                         patient with mild systemic disease. After reviewing                         the risks and benefits, the patient was deemed in                         satisfactory condition to undergo the procedure. The                         anesthesia plan was to use monitored anesthesia care                         (MAC). Immediately prior to administration of                         medications, the patient was re-assessed for adequacy                         to receive sedatives. The heart rate, respiratory                         rate, oxygen saturations, blood pressure, adequacy of                         pulmonary ventilation, and response to care were                         monitored throughout the procedure. The physical                         status of the patient was re-assessed after the                         procedure.                        After obtaining informed consent, the colonoscope was                         passed under direct vision. Throughout the procedure,                         the patient's blood pressure, pulse, and oxygen  saturations were monitored continuously. The                         Colonoscope was introduced through the anus and                         advanced to the the cecum, identified by appendiceal                         orifice and ileocecal valve. The colonoscopy was                         performed without difficulty. The patient tolerated                         the procedure well. The quality of the bowel                         preparation was good. The ileocecal valve, appendiceal                         orifice, and rectum were photographed. Findings:      The perianal and digital rectal examinations were normal.      A 2 mm  polyp was found in the ascending colon. The polyp was sessile.       The polyp was removed with a cold snare. Resection and retrieval were       complete. Estimated blood loss was minimal.      Internal hemorrhoids were found during retroflexion. The hemorrhoids       were Grade I (internal hemorrhoids that do not prolapse).      The exam was otherwise without abnormality on direct and retroflexion       views. Impression:            - One 2 mm polyp in the ascending colon, removed with                         a cold snare. Resected and retrieved.                        - Internal hemorrhoids.                        - The examination was otherwise normal on direct and                         retroflexion views. Recommendation:        - Discharge patient to home.                        - Resume previous diet.                        - Continue present medications.                        - Await pathology results.                        - Repeat colonoscopy is not recommended due to current  age (40 years or older) for surveillance.                        - Return to referring physician as previously                         scheduled. Procedure Code(s):     --- Professional ---                        [phone], Colonoscopy, flexible; with removal of                         tumor(s), polyp(s), or other lesion(s) by snare                         technique Diagnosis Code(s):     --- Professional ---                        Z86.010, Personal history of colonic polyps                        D12.2, Benign neoplasm of ascending colon                        K64.0, First degree hemorrhoids CPT copyright 2022 American [employer]. All rights reserved. The codes documented in this report are preliminary and upon coder review may  be revised to meet current compliance requirements. Andrey Farmer MD, MD 06/14/2022 9:27:54 AM Number of Addenda: 0 Note Initiated On:  06/14/2022 8:52 AM Scope Withdrawal Time: 0 hours 7 minutes 23 seconds  Total Procedure Duration: 0 hours 12 minutes 42 seconds  Estimated Blood Loss:  Estimated blood loss was minimal.      [facility]

## 2022-06-14 NOTE — Anesthesia Postprocedure Evaluation (Signed)
Anesthesia Post Note  Patient: Amy Vazquez  Procedure(s) Performed: COLONOSCOPY WITH PROPOFOL  Patient location during evaluation: PACU Anesthesia Type: General Level of consciousness: awake and awake and alert Pain management: pain level controlled Vital Signs Assessment: post-procedure vital signs reviewed and stable Respiratory status: nonlabored ventilation Cardiovascular status: stable Anesthetic complications: no  There were no known notable events for this encounter.   Last Vitals:  Vitals:   06/14/22 0935 06/14/22 0945  BP: 133/73 133/79  Pulse: 84 86  Resp: 14 17  Temp:    SpO2: 100% 100%    Last Pain:  Vitals:   06/14/22 0945  TempSrc:   PainSc: 0-No pain                 VAN STAVEREN,Bradden Tadros

## 2022-06-14 NOTE — Anesthesia Preprocedure Evaluation (Addendum)
Anesthesia Evaluation  Patient identified by MRN, date of birth, ID band Patient awake    Reviewed: Allergy & Precautions, NPO status , Patient's Chart, lab work & pertinent test results  Airway Mallampati: III  TM Distance: >3 FB Neck ROM: full    Dental  (+) Teeth Intact   Pulmonary asthma , former smoker   Pulmonary exam normal breath sounds clear to auscultation       Cardiovascular Exercise Tolerance: Good hypertension, Pt. on medications Normal cardiovascular exam Rhythm:Regular Rate:Normal     Neuro/Psych  Headaches   Depression    negative neurological ROS  negative psych ROS   GI/Hepatic negative GI ROS, Neg liver ROS,GERD  Medicated,,  Endo/Other  negative endocrine ROS    Renal/GU CRFRenal disease  negative genitourinary   Musculoskeletal   Abdominal Normal abdominal exam  (+)   Peds negative pediatric ROS (+)  Hematology negative hematology ROS (+) Blood dyscrasia, anemia   Anesthesia Other Findings Past Medical History: No date: Anemia No date: Arthritis     Comment:  FINGERS No date: Asthma     Comment:  WELL CONTROLLED No date: Cervical disc disease No date: Chronic kidney disease     Comment:  Stage 4 No date: Depression No date: GERD (gastroesophageal reflux disease)     Comment:  RARE No date: Headache     Comment:  H/O MIGRAINES No date: Hypertension No date: Neuropathy     Comment:  feet  Past Surgical History: No date: ABDOMINAL HYSTERECTOMY 01/24/2020: ANTERIOR CERVICAL DECOMP/DISCECTOMY FUSION; N/A     Comment:  Procedure: ANTERIOR CERVICAL DECOMPRESSION/DISCECTOMY               FUSION 2 LEVELS C2-4;  Surgeon: Deetta Perla, MD;                Location: [facility] ORS;  Service: Neurosurgery;  Laterality:               N/A; No date: CARPAL TUNNEL RELEASE; Right 10/16/2021: CATARACT EXTRACTION W/PHACO; Right     Comment:  Procedure: CATARACT EXTRACTION PHACO AND INTRAOCULAR                LENS PLACEMENT (IOC) RIGHT 4.63 00:36.0;  Surgeon:               Birder Robson, MD;  Location: [place];                Service: Ophthalmology;  Laterality: Right; No date: CHOLECYSTECTOMY No date: COLONOSCOPY 06/24/2016: COLONOSCOPY WITH PROPOFOL; N/A     Comment:  Procedure: COLONOSCOPY WITH PROPOFOL;  Surgeon: Lollie Sails, MD;  Location: [facility] ENDOSCOPY;  Service:               Endoscopy;  Laterality: N/A; No date: NECK SURGERY     Comment:  FUSION  No date: WISDOM TOOTH EXTRACTION     Reproductive/Obstetrics negative OB ROS                             Anesthesia Physical Anesthesia Plan  ASA: 2  Anesthesia Plan: General   Post-op Pain Management:    Induction:   PONV Risk Score and Plan: Propofol infusion and TIVA  Airway Management Planned: Natural Airway  Additional Equipment:   Intra-op Plan:   Post-operative Plan:   Informed Consent: I have reviewed the patients History and  Physical, chart, labs and discussed the procedure including the risks, benefits and alternatives for the proposed anesthesia with the patient or authorized representative who has indicated his/her understanding and acceptance.     Dental Advisory Given  Plan Discussed with: CRNA and Surgeon  Anesthesia Plan Comments:        Anesthesia Quick Evaluation

## 2022-06-14 NOTE — Transfer of Care (Signed)
Immediate Anesthesia Transfer of Care Note  Patient: Amy Vazquez  Procedure(s) Performed: COLONOSCOPY WITH PROPOFOL  Patient Location: PACU  Anesthesia Type:General  Level of Consciousness: awake, alert , and oriented  Airway & Oxygen Therapy: Patient Spontanous Breathing  Post-op Assessment: Report given to RN and Post -op Vital signs reviewed and stable  Post vital signs: Reviewed and stable  Last Vitals:  Vitals Value Taken Time  BP    Temp    Pulse    Resp    SpO2      Last Pain:  Vitals:   06/14/22 0757  TempSrc: Temporal  PainSc: 0-No pain         Complications: No notable events documented.

## 2022-06-14 NOTE — H&P (Signed)
Outpatient short stay form Pre-procedure 06/14/2022  Amy Rubenstein, MD  Primary Physician: Kirk Ruths, MD  Reason for visit:  Surveillance colonoscopy  History of present illness:    77 y/o lady with history of hypertension, CKD, and HLD here for surveillance colonoscopy. No blood thinners. No significant abdominal surgeries. No family history of GI malignancies.    Current Facility-Administered Medications:    0.9 %  sodium chloride infusion, , Intravenous, Continuous, Thomos Domine, Hilton Cork, MD, Last Rate: 20 mL/hr at 06/14/22 0811, 1,000 mL at 06/14/22 0811  Medications Prior to Admission  Medication Sig Dispense Refill Last Dose   buPROPion (WELLBUTRIN SR) 150 MG 12 hr tablet Take 150 mg by mouth 2 (two) times daily.   06/13/2022   gabapentin (NEURONTIN) 300 MG capsule Take 300 mg by mouth.   06/13/2022   lisinopril-hydrochlorothiazide (PRINZIDE,ZESTORETIC) 20-25 MG tablet Take 1 tablet by mouth every evening.    06/13/2022   pravastatin (PRAVACHOL) 40 MG tablet Take 40 mg by mouth at bedtime.    06/13/2022   sertraline (ZOLOFT) 100 MG tablet Take 100 mg by mouth every evening.    06/13/2022   verapamil (CALAN-SR) 180 MG CR tablet Take 180 mg by mouth at bedtime.   06/13/2022   WIXELA INHUB 250-50 MCG/DOSE AEPB Inhale 1 puff into the lungs every 12 (twelve) hours.   06/13/2022   acetaminophen (TYLENOL) 500 MG tablet Take 2 tablets (1,000 mg total) by mouth every 8 (eight) hours. 30 tablet 0  at prn   Ascorbic Acid (VITAMIN C PO) Take 1 tablet by mouth daily. (Patient not taking: Reported on 10/08/2021)      Cholecalciferol 25 MCG (1000 UT) tablet Take 1,000 Units by mouth daily. (Patient not taking: Reported on 10/08/2021)      cholestyramine (QUESTRAN) 4 g packet Take 1 packet by mouth 2 (two) times daily as needed (diarrhea).  (Patient not taking: Reported on 01/24/2020)      cyanocobalamin 1000 MCG tablet Take 1,000 mcg by mouth daily. (Patient not taking: Reported on 10/08/2021)       ferrous sulfate 325 (65 FE) MG tablet Take 325 mg by mouth daily. (Patient not taking: Reported on 10/08/2021)        No Known Allergies   Past Medical History:  Diagnosis Date   Anemia    Arthritis    FINGERS   Asthma    WELL CONTROLLED   Cervical disc disease    Chronic kidney disease    Stage 4   Depression    GERD (gastroesophageal reflux disease)    RARE   Headache    H/O MIGRAINES   Hypertension    Neuropathy    feet    Review of systems:  Otherwise negative.    Physical Exam  Gen: Alert, oriented. Appears stated age.  HEENT: PERRLA. Lungs: No respiratory distress CV: RRR Abd: soft, benign, no masses Ext: No edema    Planned procedures: Proceed with colonoscopy. The patient understands the nature of the planned procedure, indications, risks, alternatives and potential complications including but not limited to bleeding, infection, perforation, damage to internal organs and possible oversedation/side effects from anesthesia. The patient agrees and gives consent to proceed.  Please refer to procedure notes for findings, recommendations and patient disposition/instructions.     Amy Rubenstein, MD [facility]

## 2022-06-14 NOTE — Interval H&P Note (Signed)
History and Physical Interval Note:  06/14/2022 9:01 AM  Amy Vazquez  has presented today for surgery, with the diagnosis of HX OF COLON POLYPS.  The various methods of treatment have been discussed with the patient and family. After consideration of risks, benefits and other options for treatment, the patient has consented to  Procedure(s): COLONOSCOPY WITH PROPOFOL (N/A) as a surgical intervention.  The patient's history has been reviewed, patient examined, no change in status, stable for surgery.  I have reviewed the patient's chart and labs.  Questions were answered to the patient's satisfaction.     Lesly Rubenstein  Ok to proceed with colonoscopy

## 2022-06-16 NOTE — Progress Notes (Signed)
Non-identified Voicemail.  No Message Left.

## 2022-06-17 ENCOUNTER — Encounter: Payer: Self-pay | Admitting: Gastroenterology

## 2022-06-18 LAB — SURGICAL PATHOLOGY

## 2022-06-19 DIAGNOSIS — I700 Atherosclerosis of aorta: Secondary | ICD-10-CM | POA: Diagnosis not present

## 2022-06-19 DIAGNOSIS — I10 Essential (primary) hypertension: Secondary | ICD-10-CM | POA: Diagnosis not present

## 2022-06-19 DIAGNOSIS — N184 Chronic kidney disease, stage 4 (severe): Secondary | ICD-10-CM | POA: Diagnosis not present

## 2022-06-26 DIAGNOSIS — Z87891 Personal history of nicotine dependence: Secondary | ICD-10-CM | POA: Diagnosis not present

## 2022-06-26 DIAGNOSIS — N184 Chronic kidney disease, stage 4 (severe): Secondary | ICD-10-CM | POA: Diagnosis not present

## 2022-06-26 DIAGNOSIS — F325 Major depressive disorder, single episode, in full remission: Secondary | ICD-10-CM | POA: Diagnosis not present

## 2022-06-26 DIAGNOSIS — I129 Hypertensive chronic kidney disease with stage 1 through stage 4 chronic kidney disease, or unspecified chronic kidney disease: Secondary | ICD-10-CM | POA: Diagnosis not present

## 2022-06-26 DIAGNOSIS — E538 Deficiency of other specified B group vitamins: Secondary | ICD-10-CM | POA: Diagnosis not present

## 2022-06-26 DIAGNOSIS — I700 Atherosclerosis of aorta: Secondary | ICD-10-CM | POA: Diagnosis not present

## 2022-06-26 DIAGNOSIS — E7800 Pure hypercholesterolemia, unspecified: Secondary | ICD-10-CM | POA: Diagnosis not present

## 2022-07-03 DIAGNOSIS — M47816 Spondylosis without myelopathy or radiculopathy, lumbar region: Secondary | ICD-10-CM | POA: Diagnosis not present

## 2022-07-31 DIAGNOSIS — M47816 Spondylosis without myelopathy or radiculopathy, lumbar region: Secondary | ICD-10-CM | POA: Diagnosis not present

## 2022-07-31 DIAGNOSIS — M5136 Other intervertebral disc degeneration, lumbar region: Secondary | ICD-10-CM | POA: Diagnosis not present

## 2022-08-29 DIAGNOSIS — N184 Chronic kidney disease, stage 4 (severe): Secondary | ICD-10-CM | POA: Diagnosis not present

## 2022-08-29 DIAGNOSIS — I10 Essential (primary) hypertension: Secondary | ICD-10-CM | POA: Diagnosis not present

## 2022-08-29 DIAGNOSIS — I129 Hypertensive chronic kidney disease with stage 1 through stage 4 chronic kidney disease, or unspecified chronic kidney disease: Secondary | ICD-10-CM | POA: Diagnosis not present

## 2022-08-29 DIAGNOSIS — N2581 Secondary hyperparathyroidism of renal origin: Secondary | ICD-10-CM | POA: Diagnosis not present

## 2022-08-29 DIAGNOSIS — Q600 Renal agenesis, unilateral: Secondary | ICD-10-CM | POA: Diagnosis not present

## 2022-09-10 DIAGNOSIS — N2581 Secondary hyperparathyroidism of renal origin: Secondary | ICD-10-CM | POA: Diagnosis not present

## 2022-09-10 DIAGNOSIS — R809 Proteinuria, unspecified: Secondary | ICD-10-CM | POA: Diagnosis not present

## 2022-09-10 DIAGNOSIS — N184 Chronic kidney disease, stage 4 (severe): Secondary | ICD-10-CM | POA: Diagnosis not present

## 2022-09-10 DIAGNOSIS — I10 Essential (primary) hypertension: Secondary | ICD-10-CM | POA: Diagnosis not present

## 2022-09-10 DIAGNOSIS — Q600 Renal agenesis, unilateral: Secondary | ICD-10-CM | POA: Diagnosis not present

## 2022-09-10 DIAGNOSIS — R8290 Unspecified abnormal findings in urine: Secondary | ICD-10-CM | POA: Diagnosis not present

## 2022-09-10 DIAGNOSIS — R609 Edema, unspecified: Secondary | ICD-10-CM | POA: Diagnosis not present

## 2022-09-10 DIAGNOSIS — D631 Anemia in chronic kidney disease: Secondary | ICD-10-CM | POA: Diagnosis not present

## 2022-11-14 DIAGNOSIS — L03116 Cellulitis of left lower limb: Secondary | ICD-10-CM | POA: Diagnosis not present

## 2022-12-18 DIAGNOSIS — N184 Chronic kidney disease, stage 4 (severe): Secondary | ICD-10-CM | POA: Diagnosis not present

## 2022-12-18 DIAGNOSIS — I10 Essential (primary) hypertension: Secondary | ICD-10-CM | POA: Diagnosis not present

## 2022-12-18 DIAGNOSIS — E538 Deficiency of other specified B group vitamins: Secondary | ICD-10-CM | POA: Diagnosis not present

## 2022-12-18 DIAGNOSIS — E7800 Pure hypercholesterolemia, unspecified: Secondary | ICD-10-CM | POA: Diagnosis not present

## 2022-12-25 DIAGNOSIS — Z0000 Encounter for general adult medical examination without abnormal findings: Secondary | ICD-10-CM | POA: Diagnosis not present

## 2022-12-25 DIAGNOSIS — Z1331 Encounter for screening for depression: Secondary | ICD-10-CM | POA: Diagnosis not present

## 2022-12-25 DIAGNOSIS — N184 Chronic kidney disease, stage 4 (severe): Secondary | ICD-10-CM | POA: Diagnosis not present

## 2022-12-25 DIAGNOSIS — I129 Hypertensive chronic kidney disease with stage 1 through stage 4 chronic kidney disease, or unspecified chronic kidney disease: Secondary | ICD-10-CM | POA: Diagnosis not present

## 2022-12-25 DIAGNOSIS — G5601 Carpal tunnel syndrome, right upper limb: Secondary | ICD-10-CM | POA: Diagnosis not present

## 2022-12-25 DIAGNOSIS — F325 Major depressive disorder, single episode, in full remission: Secondary | ICD-10-CM | POA: Diagnosis not present

## 2022-12-25 DIAGNOSIS — Z5320 Procedure and treatment not carried out because of patient's decision for unspecified reasons: Secondary | ICD-10-CM | POA: Diagnosis not present

## 2022-12-25 DIAGNOSIS — Z87891 Personal history of nicotine dependence: Secondary | ICD-10-CM | POA: Diagnosis not present

## 2023-01-13 DIAGNOSIS — G5601 Carpal tunnel syndrome, right upper limb: Secondary | ICD-10-CM | POA: Diagnosis not present

## 2023-01-13 DIAGNOSIS — Z9889 Other specified postprocedural states: Secondary | ICD-10-CM | POA: Diagnosis not present

## 2023-02-12 ENCOUNTER — Encounter
Admission: RE | Admit: 2023-02-12 | Discharge: 2023-02-12 | Disposition: A | Discharge status: Home / Self Care | Payer: Medicare HMO | Source: Ambulatory Visit | Attending: Surgery | Admitting: Surgery

## 2023-02-12 ENCOUNTER — Other Ambulatory Visit: Payer: Self-pay | Admitting: Surgery

## 2023-02-12 VITALS — Ht 62.0 in | Wt 130.1 lb

## 2023-02-12 DIAGNOSIS — I2510 Atherosclerotic heart disease of native coronary artery without angina pectoris: Secondary | ICD-10-CM

## 2023-02-12 DIAGNOSIS — Z79899 Other long term (current) drug therapy: Secondary | ICD-10-CM

## 2023-02-12 DIAGNOSIS — Z01810 Encounter for preprocedural cardiovascular examination: Secondary | ICD-10-CM

## 2023-02-12 DIAGNOSIS — I10 Essential (primary) hypertension: Secondary | ICD-10-CM

## 2023-02-12 DIAGNOSIS — Z01812 Encounter for preprocedural laboratory examination: Secondary | ICD-10-CM

## 2023-02-12 NOTE — Patient Instructions (Signed)
Your procedure is scheduled on:02-19-23 Wednesday Report to the Registration Desk on the 1st floor of the [facility].Then proceed to the 2nd floor Surgery Desk To find out your arrival time, please call [phone] between 1PM - 3PM on:02-28-23 Tuesday If your arrival time is 6:00 am, do not arrive before that time as the [facility] entrance doors do not open until 6:00 am.  REMEMBER: Instructions that are not followed completely may result in serious medical risk, up to and including death; or upon the discretion of your surgeon and anesthesiologist your surgery may need to be rescheduled.  Do not eat food after midnight the night before surgery.  No gum chewing or hard candies.  You may however, drink CLEAR liquids up to 2 hours before you are scheduled to arrive for your surgery. Do not drink anything within 2 hours of your scheduled arrival time.  Clear liquids include: - water  - apple juice without pulp - gatorade (not RED colors) - black coffee or tea (Do NOT add milk or creamers to the coffee or tea) Do NOT drink anything that is not on this list.  In addition, your doctor has ordered for you to drink the provided:  Ensure Pre-Surgery Clear Carbohydrate Drink  Drinking this carbohydrate drink up to two hours before surgery helps to reduce insulin resistance and improve patient outcomes. Please complete drinking 2 hours before scheduled arrival time.  One week prior to surgery: Stop Anti-inflammatories (NSAIDS) such as Advil, Aleve, Ibuprofen, Motrin, Naproxen, Naprosyn and Aspirin based products such as Excedrin, Goody's Powder, BC Powder. Stop ANY OVER THE COUNTER supplements/vitamins NOW (02-12-23) until after surgery (Vitamin D and B12)  You may however, continue to take Tylenol if needed for pain up until the day of surgery.  Continue taking all of your other prescription medications up until the day of surgery.  ON THE DAY OF SURGERY ONLY TAKE THESE MEDICATIONS WITH  SIPS OF WATER: -buPROPion (WELLBUTRIN SR)  -gabapentin (NEURONTIN)   Use your Wixela Inhaler the day of surgery  No Alcohol for 24 hours before or after surgery.  No Smoking including e-cigarettes for 24 hours before surgery.  No chewable tobacco products for at least 6 hours before surgery.  No nicotine patches on the day of surgery.  Do not use any "recreational" drugs for at least a week (preferably 2 weeks) before your surgery.  Please be advised that the combination of cocaine and anesthesia may have negative outcomes, up to and including death. If you test positive for cocaine, your surgery will be cancelled.  On the morning of surgery brush your teeth with toothpaste and water, you may rinse your mouth with mouthwash if you wish. Do not swallow any toothpaste or mouthwash.  Use CHG Soap as directed on instruction sheet.  Do not wear jewelry, make-up, hairpins, clips or nail polish.  For welded (permanent) jewelry: bracelets, anklets, waist bands, etc.  Please have this removed prior to surgery.  If it is not removed, there is a chance that hospital personnel will need to cut it off on the day of surgery.  Do not wear lotions, powders, or perfumes.   Do not shave body hair from the neck down 48 hours before surgery.  Contact lenses, hearing aids and dentures may not be worn into surgery.  Do not bring valuables to the hospital. [facility] is not responsible for any missing/lost belongings or valuables.   Notify your doctor if there is any change in your medical  condition (cold, fever, infection).  Wear comfortable clothing (specific to your surgery type) to the hospital.  After surgery, you can help prevent lung complications by doing breathing exercises.  Take deep breaths and cough every 1-2 hours. Your doctor may order a device called an Incentive Spirometer to help you take deep breaths. When coughing or sneezing, hold a pillow firmly against your incision with both  hands. This is called "splinting." Doing this helps protect your incision. It also decreases belly discomfort.  If you are being admitted to the hospital overnight, leave your suitcase in the car. After surgery it may be brought to your room.  In case of increased patient census, it may be necessary for you, the patient, to continue your postoperative care in the Same Day Surgery department.  If you are being discharged the day of surgery, you will not be allowed to drive home. You will need a responsible individual to drive you home and stay with you for 24 hours after surgery.   If you are taking public transportation, you will need to have a responsible individual with you.  Please call the Pre-admissions Testing Dept. at [phone] if you have any questions about these instructions.  Surgery Visitation Policy:  Patients having surgery or a procedure may have two visitors.  Children under the age of 46 must have an adult with them who is not the patient.     Preparing for Surgery with CHLORHEXIDINE GLUCONATE (CHG) Soap  Chlorhexidine Gluconate (CHG) Soap  o An antiseptic cleaner that kills germs and bonds with the skin to continue killing germs even after washing  o Used for showering the night before surgery and morning of surgery  Before surgery, you can play an important role by reducing the number of germs on your skin.  CHG (Chlorhexidine gluconate) soap is an antiseptic cleanser which kills germs and bonds with the skin to continue killing germs even after washing.  Please do not use if you have an allergy to CHG or antibacterial soaps. If your skin becomes reddened/irritated stop using the CHG.  1. Shower the NIGHT BEFORE SURGERY and the MORNING OF SURGERY with CHG soap.  2. If you choose to wash your hair, wash your hair first as usual with your normal shampoo.  3. After shampooing, rinse your hair and body thoroughly to remove the shampoo.  4. Use CHG as you  would any other liquid soap. You can apply CHG directly to the skin and wash gently with a scrungie or a clean washcloth.  5. Apply the CHG soap to your body only from the neck down. Do not use on open wounds or open sores. Avoid contact with your eyes, ears, mouth, and genitals (private parts). Wash face and genitals (private parts) with your normal soap.  6. Wash thoroughly, paying special attention to the area where your surgery will be performed.  7. Thoroughly rinse your body with warm water.  8. Do not shower/wash with your normal soap after using and rinsing off the CHG soap.  9. Pat yourself dry with a clean towel.  10. Wear clean pajamas to bed the night before surgery.  12. Place clean sheets on your bed the night of your first shower and do not sleep with pets.  13. Shower again with the CHG soap on the day of surgery prior to arriving at the hospital.  14. Do not apply any deodorants/lotions/powders.  15. Please wear clean clothes to the hospital.  How to Use  an Facilities manager An incentive spirometer is a tool that measures how well you are filling your lungs with each breath. Learning to take long, deep breaths using this tool can help you keep your lungs clear and active. This may help to reverse or lessen your chance of developing breathing (pulmonary) problems, especially infection. You may be asked to use a spirometer: After a surgery. If you have a lung problem or a history of smoking. After a long period of time when you have been unable to move or be active. If the spirometer includes an indicator to show the highest number that you have reached, your health care provider or respiratory therapist will help you set a goal. Keep a log of your progress as told by your health care provider. What are the risks? Breathing too quickly may cause dizziness or cause you to pass out. Take your time so you do not get dizzy or light-headed. If you are in pain, you may need  to take pain medicine before doing incentive spirometry. It is harder to take a deep breath if you are having pain. How to use your incentive spirometer  Sit up on the edge of your bed or on a chair. Hold the incentive spirometer so that it is in an upright position. Before you use the spirometer, breathe out normally. Place the mouthpiece in your mouth. Make sure your lips are closed tightly around it. Breathe in slowly and as deeply as you can through your mouth, causing the piston or the ball to rise toward the top of the chamber. Hold your breath for 3-5 seconds, or for as long as possible. If the spirometer includes a coach indicator, use this to guide you in breathing. Slow down your breathing if the indicator goes above the marked areas. Remove the mouthpiece from your mouth and breathe out normally. The piston or ball will return to the bottom of the chamber. Rest for a few seconds, then repeat the steps 10 or more times. Take your time and take a few normal breaths between deep breaths so that you do not get dizzy or light-headed. Do this every 1-2 hours when you are awake. If the spirometer includes a goal marker to show the highest number you have reached (best effort), use this as a goal to work toward during each repetition. After each set of 10 deep breaths, cough a few times. This will help to make sure that your lungs are clear. If you have an incision on your chest or abdomen from surgery, place a pillow or a rolled-up towel firmly against the incision when you cough. This can help to reduce pain while taking deep breaths and coughing. General tips When you are able to get out of bed: Walk around often. Continue to take deep breaths and cough in order to clear your lungs. Keep using the incentive spirometer until your health care provider says it is okay to stop using it. If you have been in the hospital, you may be told to keep using the spirometer at home. Contact a health  care provider if: You are having difficulty using the spirometer. You have trouble using the spirometer as often as instructed. Your pain medicine is not giving enough relief for you to use the spirometer as told. You have a fever. Get help right away if: You develop shortness of breath. You develop a cough with bloody mucus from the lungs. You have fluid or blood coming from an incision site after  you cough. Summary An incentive spirometer is a tool that can help you learn to take long, deep breaths to keep your lungs clear and active. You may be asked to use a spirometer after a surgery, if you have a lung problem or a history of smoking, or if you have been inactive for a long period of time. Use your incentive spirometer as instructed every 1-2 hours while you are awake. If you have an incision on your chest or abdomen, place a pillow or a rolled-up towel firmly against your incision when you cough. This will help to reduce pain. Get help right away if you have shortness of breath, you cough up bloody mucus, or blood comes from your incision when you cough. This information is not intended to replace advice given to you by your health care provider. Make sure you discuss any questions you have with your health care provider. Document Revised: 07/12/2019 Document Reviewed: 07/12/2019 Elsevier Patient Education  2024 [employer].

## 2023-02-13 ENCOUNTER — Encounter
Admission: RE | Admit: 2023-02-13 | Discharge: 2023-02-13 | Disposition: A | Discharge status: Home / Self Care | Payer: Medicare HMO | Source: Ambulatory Visit | Attending: Surgery | Admitting: Surgery

## 2023-02-13 DIAGNOSIS — Z01812 Encounter for preprocedural laboratory examination: Secondary | ICD-10-CM | POA: Insufficient documentation

## 2023-02-13 DIAGNOSIS — I10 Essential (primary) hypertension: Secondary | ICD-10-CM | POA: Diagnosis not present

## 2023-02-13 DIAGNOSIS — Z01810 Encounter for preprocedural cardiovascular examination: Secondary | ICD-10-CM | POA: Diagnosis not present

## 2023-02-13 DIAGNOSIS — Z79899 Other long term (current) drug therapy: Secondary | ICD-10-CM | POA: Insufficient documentation

## 2023-02-13 LAB — BASIC METABOLIC PANEL
Anion gap: 10 (ref 5–15)
BUN: 31 mg/dL — ABNORMAL HIGH (ref 8–23)
CO2: 25 mmol/L (ref 22–32)
Calcium: 9.8 mg/dL (ref 8.9–10.3)
Chloride: 102 mmol/L (ref 98–111)
Creatinine, Ser: 2.22 mg/dL — ABNORMAL HIGH (ref 0.44–1.00)
GFR, Estimated: 22 mL/min — ABNORMAL LOW (ref >60)
Glucose, Bld: 82 mg/dL (ref 70–99)
Potassium: 3.3 mmol/L — ABNORMAL LOW (ref 3.5–5.1)
Sodium: 137 mmol/L (ref 135–145)

## 2023-02-13 LAB — CBC
HCT: 35.7 % — ABNORMAL LOW (ref 36.0–46.0)
Hemoglobin: 12.3 g/dL (ref 12.0–15.0)
MCH: 30.7 pg (ref 26.0–34.0)
MCHC: 34.5 g/dL (ref 30.0–36.0)
MCV: 89 fL (ref 80.0–100.0)
Platelets: 295 10*3/uL (ref 150–400)
RBC: 4.01 MIL/uL (ref 3.87–5.11)
RDW: 12.5 % (ref 11.5–15.5)
WBC: 6.9 10*3/uL (ref 4.0–10.5)
nRBC: 0 % (ref 0.0–0.2)

## 2023-02-18 DIAGNOSIS — G5601 Carpal tunnel syndrome, right upper limb: Secondary | ICD-10-CM | POA: Diagnosis not present

## 2023-02-19 ENCOUNTER — Other Ambulatory Visit: Payer: Self-pay

## 2023-02-19 ENCOUNTER — Ambulatory Visit: Payer: Medicare HMO | Admitting: Anesthesiology

## 2023-02-19 ENCOUNTER — Ambulatory Visit: Payer: Medicare HMO | Admitting: Urgent Care

## 2023-02-19 ENCOUNTER — Ambulatory Visit
Admission: RE | Admit: 2023-02-19 | Discharge: 2023-02-19 | Disposition: A | Discharge status: Home / Self Care | Payer: Medicare HMO | Attending: Surgery | Admitting: Surgery

## 2023-02-19 ENCOUNTER — Encounter
Admission: RE | Disposition: A | Discharge status: Home / Self Care | Payer: Self-pay | Source: Home / Self Care | Attending: Surgery

## 2023-02-19 ENCOUNTER — Encounter: Payer: Self-pay | Admitting: Surgery

## 2023-02-19 DIAGNOSIS — F17200 Nicotine dependence, unspecified, uncomplicated: Secondary | ICD-10-CM | POA: Insufficient documentation

## 2023-02-19 DIAGNOSIS — N184 Chronic kidney disease, stage 4 (severe): Secondary | ICD-10-CM | POA: Insufficient documentation

## 2023-02-19 DIAGNOSIS — G5601 Carpal tunnel syndrome, right upper limb: Secondary | ICD-10-CM | POA: Diagnosis not present

## 2023-02-19 DIAGNOSIS — J4489 Other specified chronic obstructive pulmonary disease: Secondary | ICD-10-CM | POA: Diagnosis not present

## 2023-02-19 DIAGNOSIS — I129 Hypertensive chronic kidney disease with stage 1 through stage 4 chronic kidney disease, or unspecified chronic kidney disease: Secondary | ICD-10-CM | POA: Diagnosis not present

## 2023-02-19 DIAGNOSIS — G629 Polyneuropathy, unspecified: Secondary | ICD-10-CM | POA: Insufficient documentation

## 2023-02-19 DIAGNOSIS — F32A Depression, unspecified: Secondary | ICD-10-CM | POA: Diagnosis not present

## 2023-02-19 DIAGNOSIS — Z79899 Other long term (current) drug therapy: Secondary | ICD-10-CM | POA: Insufficient documentation

## 2023-02-19 HISTORY — PX: CARPAL TUNNEL RELEASE: SHX101

## 2023-02-19 SURGERY — CARPAL TUNNEL RELEASE
Anesthesia: General | Site: Wrist | Laterality: Right

## 2023-02-19 MED ORDER — LACTATED RINGERS IV SOLN: INTRAVENOUS | Status: DC

## 2023-02-19 MED ORDER — PHENYLEPHRINE 80 MCG/ML (10ML) SYRINGE FOR IV PUSH (FOR BLOOD PRESSURE SUPPORT)
PREFILLED_SYRINGE | INTRAVENOUS | Status: DC | PRN
Start: 2023-02-19 — End: 2023-02-19
  Administered 2023-02-19: 160 ug via INTRAVENOUS

## 2023-02-19 MED ORDER — CHLORHEXIDINE GLUCONATE 0.12 % MT SOLN
15.0000 mL | Freq: Once | OROMUCOSAL | Status: AC
  Administered 2023-02-19: 15 mL via OROMUCOSAL

## 2023-02-19 MED ORDER — DEXMEDETOMIDINE HCL IN NACL 80 MCG/20ML IV SOLN
INTRAVENOUS | Status: DC | PRN
Start: 2023-02-19 — End: 2023-02-19
  Administered 2023-02-19: 12 ug via INTRAVENOUS

## 2023-02-19 MED ORDER — CHLORHEXIDINE GLUCONATE 0.12 % MT SOLN
OROMUCOSAL | Status: AC
  Filled 2023-02-19: qty 15

## 2023-02-19 MED ORDER — GLYCOPYRROLATE 0.2 MG/ML IJ SOLN
INTRAMUSCULAR | Status: DC | PRN
Start: 2023-02-19 — End: 2023-02-19
  Administered 2023-02-19: .2 mg via INTRAVENOUS

## 2023-02-19 MED ORDER — FENTANYL CITRATE (PF) 100 MCG/2ML IJ SOLN
INTRAMUSCULAR | Status: AC
  Filled 2023-02-19: qty 2

## 2023-02-19 MED ORDER — CEFAZOLIN SODIUM-DEXTROSE 2-4 GM/100ML-% IV SOLN
2.0000 g | INTRAVENOUS | Status: AC
  Administered 2023-02-19: 2 g via INTRAVENOUS

## 2023-02-19 MED ORDER — BUPIVACAINE HCL (PF) 0.5 % IJ SOLN
INTRAMUSCULAR | Status: AC
  Filled 2023-02-19: qty 30

## 2023-02-19 MED ORDER — OXYCODONE HCL 5 MG PO TABS: 5.0000 mg | ORAL_TABLET | Freq: Once | ORAL | Status: DC | PRN

## 2023-02-19 MED ORDER — ONDANSETRON HCL 4 MG/2ML IJ SOLN: 4.0000 mg | Freq: Once | INTRAMUSCULAR | Status: DC | PRN

## 2023-02-19 MED ORDER — ONDANSETRON HCL 4 MG/2ML IJ SOLN
INTRAMUSCULAR | Status: DC | PRN
Start: 2023-02-19 — End: 2023-02-19
  Administered 2023-02-19: 4 mg via INTRAVENOUS

## 2023-02-19 MED ORDER — FENTANYL CITRATE (PF) 100 MCG/2ML IJ SOLN: 25.0000 ug | INTRAMUSCULAR | Status: DC | PRN

## 2023-02-19 MED ORDER — CEFAZOLIN SODIUM-DEXTROSE 2-4 GM/100ML-% IV SOLN
INTRAVENOUS | Status: AC
  Filled 2023-02-19: qty 100

## 2023-02-19 MED ORDER — BUPIVACAINE HCL (PF) 0.5 % IJ SOLN
INTRAMUSCULAR | Status: DC | PRN
  Administered 2023-02-19: 10 mL

## 2023-02-19 MED ORDER — LIDOCAINE HCL (PF) 2 % IJ SOLN
INTRAMUSCULAR | Status: DC | PRN
  Administered 2023-02-19: 60 mg via INTRADERMAL

## 2023-02-19 MED ORDER — ACETAMINOPHEN 10 MG/ML IV SOLN
INTRAVENOUS | Status: DC | PRN
Start: 2023-02-19 — End: 2023-02-19
  Administered 2023-02-19: 1000 mg via INTRAVENOUS

## 2023-02-19 MED ORDER — FENTANYL CITRATE (PF) 100 MCG/2ML IJ SOLN
INTRAMUSCULAR | Status: DC | PRN
Start: 2023-02-19 — End: 2023-02-19
  Administered 2023-02-19 (×2): 25 ug via INTRAVENOUS

## 2023-02-19 MED ORDER — DEXAMETHASONE SODIUM PHOSPHATE 10 MG/ML IJ SOLN
INTRAMUSCULAR | Status: DC | PRN
Start: 2023-02-19 — End: 2023-02-19
  Administered 2023-02-19: 10 mg via INTRAVENOUS

## 2023-02-19 MED ORDER — PROPOFOL 500 MG/50ML IV EMUL
INTRAVENOUS | Status: DC | PRN
Start: 2023-02-19 — End: 2023-02-19
  Administered 2023-02-19: 150 mg via INTRAVENOUS

## 2023-02-19 MED ORDER — OXYCODONE HCL 5 MG/5ML PO SOLN: 5.0000 mg | Freq: Once | ORAL | Status: DC | PRN

## 2023-02-19 MED ORDER — ORAL CARE MOUTH RINSE: 15.0000 mL | Freq: Once | OROMUCOSAL | Status: AC

## 2023-02-19 MED ORDER — ACETAMINOPHEN 10 MG/ML IV SOLN: 1000.0000 mg | Freq: Once | INTRAVENOUS | Status: DC | PRN

## 2023-02-19 SURGICAL SUPPLY — 33 items
APL PRP STRL LF DISP 70% ISPRP (MISCELLANEOUS) ×1
BNDG CMPR 5X2 KNTD ELC UNQ LF (GAUZE/BANDAGES/DRESSINGS) ×1
BNDG CMPR 5X3 KNIT ELC UNQ LF (GAUZE/BANDAGES/DRESSINGS) ×1
BNDG ELASTIC 2INX 5YD STR LF (GAUZE/BANDAGES/DRESSINGS) ×1 IMPLANT
BNDG ELASTIC 3INX 5YD STR LF (GAUZE/BANDAGES/DRESSINGS) ×1 IMPLANT
BNDG ESMARCH 4 X 12 STRL LF (GAUZE/BANDAGES/DRESSINGS) ×1
BNDG ESMARCH 4X12 STRL LF (GAUZE/BANDAGES/DRESSINGS) ×1 IMPLANT
CHLORAPREP W/TINT 26 (MISCELLANEOUS) ×1 IMPLANT
CORD BIP STRL DISP 12FT (MISCELLANEOUS) ×1 IMPLANT
CUFF TOURN SGL QUICK 18X4 (TOURNIQUET CUFF) IMPLANT
DRAPE SURG 17X11 SM STRL (DRAPES) ×2 IMPLANT
FORCEPS JEWEL BIP 4-3/4 STR (INSTRUMENTS) ×1 IMPLANT
GAUZE SPONGE 4X4 12PLY STRL (GAUZE/BANDAGES/DRESSINGS) ×1 IMPLANT
GAUZE XEROFORM 1X8 LF (GAUZE/BANDAGES/DRESSINGS) ×1 IMPLANT
GLOVE BIO SURGEON STRL SZ8 (GLOVE) ×2 IMPLANT
GLOVE INDICATOR 8.0 STRL GRN (GLOVE) ×1 IMPLANT
GOWN STRL REUS W/ TWL LRG LVL3 (GOWN DISPOSABLE) ×1 IMPLANT
GOWN STRL REUS W/ TWL XL LVL3 (GOWN DISPOSABLE) ×1 IMPLANT
GOWN STRL REUS W/TWL LRG LVL3 (GOWN DISPOSABLE) ×1
GOWN STRL REUS W/TWL XL LVL3 (GOWN DISPOSABLE) ×1
KIT TURNOVER KIT A (KITS) ×1 IMPLANT
MANIFOLD NEPTUNE II (INSTRUMENTS) ×1 IMPLANT
NDL HYPO 25X1 1.5 SAFETY (NEEDLE) ×1 IMPLANT
NEEDLE HYPO 25X1 1.5 SAFETY (NEEDLE) ×1 IMPLANT
NS IRRIG 500ML POUR BTL (IV SOLUTION) ×1 IMPLANT
PACK EXTREMITY ARMC (MISCELLANEOUS) ×1 IMPLANT
SPLINT WRIST LG LT TX990309 (SOFTGOODS) ×1 IMPLANT
SPLINT WRIST M LT TX990308 (SOFTGOODS) ×1 IMPLANT
SPLINT WRIST M RT TX990303 (SOFTGOODS) ×1 IMPLANT
STOCKINETTE IMPERVIOUS 9X36 MD (GAUZE/BANDAGES/DRESSINGS) ×1 IMPLANT
SUT PROLENE 4 0 PS 2 18 (SUTURE) ×1 IMPLANT
TRAP FLUID SMOKE EVACUATOR (MISCELLANEOUS) ×1 IMPLANT
WATER STERILE IRR 500ML POUR (IV SOLUTION) ×1 IMPLANT

## 2023-02-19 NOTE — Anesthesia Postprocedure Evaluation (Signed)
Anesthesia Post Note  Patient: Amy Vazquez  Procedure(s) Performed: REVISION OPEN CARPAL TUNNEL RELEASE (Right: Wrist)  Patient location during evaluation: PACU Anesthesia Type: General Level of consciousness: awake and alert Pain management: pain level controlled Vital Signs Assessment: post-procedure vital signs reviewed and stable Respiratory status: spontaneous breathing, nonlabored ventilation, respiratory function stable and patient connected to nasal cannula oxygen Cardiovascular status: blood pressure returned to baseline and stable Postop Assessment: no apparent nausea or vomiting Anesthetic complications: no   There were no known notable events for this encounter.   Last Vitals:  Vitals:   02/19/23 1411 02/19/23 1422  BP:  (!) 159/83  Pulse: 81 79  Resp: 10 18  Temp: (!) 36.3 C (!) 36 C  SpO2: 98% 100%    Last Pain:  Vitals:   02/19/23 1422  TempSrc: Temporal  PainSc: 0-No pain                 Corinda Gubler

## 2023-02-19 NOTE — Anesthesia Procedure Notes (Signed)
Procedure Name: LMA Insertion Date/Time: 02/19/2023 12:52 PM  Performed by: Maryla Morrow., CRNAPre-anesthesia Checklist: Patient identified, Patient being monitored, Timeout performed, Emergency Drugs available and Suction available Patient Re-evaluated:Patient Re-evaluated prior to induction Oxygen Delivery Method: Circle system utilized Preoxygenation: Pre-oxygenation with 100% oxygen Induction Type: IV induction Ventilation: Mask ventilation without difficulty LMA: LMA inserted LMA Size: 3.0 Tube type: Oral Number of attempts: 1 Placement Confirmation: positive ETCO2 and breath sounds checked- equal and bilateral Tube secured with: Tape Dental Injury: Teeth and Oropharynx as per pre-operative assessment

## 2023-02-19 NOTE — Anesthesia Preprocedure Evaluation (Signed)
Anesthesia Evaluation  Patient identified by MRN, date of birth, ID band Patient awake    Reviewed: Allergy & Precautions, NPO status , Patient's Chart, lab work & pertinent test results  History of Anesthesia Complications Negative for: history of anesthetic complications  Airway Mallampati: III  TM Distance: >3 FB Neck ROM: full    Dental no notable dental hx. (+) Teeth Intact   Pulmonary asthma , neg sleep apnea, neg COPD, Current Smoker and Patient abstained from smoking.   Pulmonary exam normal breath sounds clear to auscultation       Cardiovascular Exercise Tolerance: Good METShypertension, Pt. on medications (-) CAD and (-) Past MI Normal cardiovascular exam(-) dysrhythmias  Rhythm:Regular Rate:Normal - Systolic murmurs    Neuro/Psych  Headaches PSYCHIATRIC DISORDERS  Depression       GI/Hepatic Neg liver ROS,GERD  Controlled,,  Endo/Other  negative endocrine ROSneg diabetes    Renal/GU CRFRenal disease  negative genitourinary   Musculoskeletal   Abdominal Normal abdominal exam  (+)   Peds negative pediatric ROS (+)  Hematology negative hematology ROS (+) Blood dyscrasia, anemia   Anesthesia Other Findings Past Medical History: No date: Anemia No date: Arthritis     Comment:  FINGERS No date: Asthma     Comment:  WELL CONTROLLED No date: Cervical disc disease No date: Chronic kidney disease     Comment:  Stage 4 No date: Depression No date: GERD (gastroesophageal reflux disease)     Comment:  RARE No date: Headache     Comment:  H/O MIGRAINES No date: Hypertension No date: Neuropathy     Comment:  feet  Past Surgical History: No date: ABDOMINAL HYSTERECTOMY 01/24/2020: ANTERIOR CERVICAL DECOMP/DISCECTOMY FUSION; N/A     Comment:  Procedure: ANTERIOR CERVICAL DECOMPRESSION/DISCECTOMY               FUSION 2 LEVELS C2-4;  Surgeon: Lucy Chris, MD;                Location: [facility] ORS;  Service:  Neurosurgery;  Laterality:               N/A; No date: CARPAL TUNNEL RELEASE; Right 10/16/2021: CATARACT EXTRACTION W/PHACO; Right     Comment:  Procedure: CATARACT EXTRACTION PHACO AND INTRAOCULAR               LENS PLACEMENT (IOC) RIGHT 4.63 00:36.0;  Surgeon:               Galen Manila, MD;  Location: [facility];                Service: Ophthalmology;  Laterality: Right; No date: CHOLECYSTECTOMY No date: COLONOSCOPY 06/24/2016: COLONOSCOPY WITH PROPOFOL; N/A     Comment:  Procedure: COLONOSCOPY WITH PROPOFOL;  Surgeon: Christena Deem, MD;  Location: [facility] END[facility]COPY;  Service:               Endoscopy;  Laterality: N/A; No date: NECK SURGERY     Comment:  FUSION  No date: WISDOM TOOTH EXTRACTION     Reproductive/Obstetrics negative OB ROS                              Anesthesia Physical Anesthesia Plan  ASA: 2  Anesthesia Plan: General   Post-op Pain Management: Ofirmev IV (intra-op)*   Induction: Intravenous  PONV Risk Score and Plan: 2 and  Propofol infusion, TIVA and Treatment may vary due to age or medical condition  Airway Management Planned: Natural Airway and LMA  Additional Equipment: None  Intra-op Plan:   Post-operative Plan: Extubation in OR  Informed Consent: I have reviewed the patients History and Physical, chart, labs and discussed the procedure including the risks, benefits and alternatives for the proposed anesthesia with the patient or authorized representative who has indicated his/her understanding and acceptance.     Dental Advisory Given  Plan Discussed with: CRNA and Surgeon  Anesthesia Plan Comments: (Discussed risks of anesthesia with patient, including PONV, sore throat, lip/dental/eye damage. Rare risks discussed as well, such as cardiorespiratory and neurological sequelae, and allergic reactions. Discussed the role of CRNA in patient's perioperative care. Patient understands.)         Anesthesia Quick Evaluation

## 2023-02-19 NOTE — Op Note (Signed)
02/19/2023  1:40 PM  Patient:   Amy Vazquez  Pre-Op Diagnosis:   Recurrent right carpal tunnel syndrome.  Post-Op Diagnosis:   Same.  Procedure:   Open right carpal tunnel release.  Surgeon:   Maryagnes Amos, MD  Anesthesia:   General LMA  Findings:   As above.  Complications:   None  EBL:   0 cc  Fluids:   600 cc crystalloid  TT:   26 minutes at 250 mmHg  Drains:   None  Closure:   4-0 Prolene interrupted sutures  Brief Clinical Note:   The patient is a 77 year old female with a history of progressively worsening right hand pain and paresthesias. She is now 16 years status post an endoscopic right carpal tunnel release from which she had done quite well until her symptoms recurred. The symptoms have persisted despite medications, activity modification, etc. An EMG/NCV has confirmed the presence of recurrent carpal tunnel syndrome. The patient presents at this time for an open right carpal tunnel release.   Procedure:   The patient was brought into the operating room and lain in the supine position. After adequate general laryngeal mask anesthesia was achieved, the right hand and upper extremity were prepped with ChloraPrep solution before being draped sterilely. Preoperative antibiotics were administered. A timeout was performed to verify the appropriate surgical site before the limb was exsanguinated with an Esmarch and the tourniquet inflated to 250 mmHg.   An approximately 5-6 cm incision was made on the volar aspect of the palm, beginning at Kaplan's cardinal point and extending proximally in a zigzag fashion over the wrist flexor crease into the distal forearm. The incision was carried down through the subcutaneous tissues with care taken to identify and protect any neurovascular structures. The distal forearm fascia was penetrated just proximal to the transverse carpal ligament. The soft tissues were released off the superficial and deep surfaces of the distal forearm  fascia and this was released proximally for 3-4 cm under direct visualization. The nerve was visualized directly before a Therapist, nutritional was passed beneath the transverse carpal ligament along the ulnar aspect of the carpal tunnel and used to release any adhesions as well as to remove any adherent synovial tissue. It also was used to protect the underlying nerve as the recurrent scar tissue and residual transverse carpal ligament was transected in line with the incision. Several adhesions along the ulnar side of the nerve were released to allow the nerve to move more freely within the carpal tunnel. Hemostasis was achieved using bipolar electrocautery.  The wound was irrigated thoroughly with sterile saline solution before being closed using 4-0 Prolene interrupted sutures. A total of 10 cc of 0.5% plain Sensorcaine was injected in and around the incision before a sterile bulky dressing was applied to the wound. The patient was placed into a volar wrist splint before being awakened, extubated, and returned to the recovery room in satisfactory condition after tolerating the procedure well.

## 2023-02-19 NOTE — Transfer of Care (Signed)
Immediate Anesthesia Transfer of Care Note  Patient: Amy Vazquez  Procedure(s) Performed: REVISION OPEN CARPAL TUNNEL RELEASE (Right: Wrist)  Patient Location: PACU  Anesthesia Type:General  Level of Consciousness: drowsy  Airway & Oxygen Therapy: Patient Spontanous Breathing and Patient connected to face mask oxygen  Post-op Assessment: Report given to RN and Post -op Vital signs reviewed and stable  Post vital signs: stable  Last Vitals:  Vitals Value Taken Time  BP    Temp    Pulse 80 02/19/23 1326  Resp 11 02/19/23 1326  SpO2 100 % 02/19/23 1326  Vitals shown include unfiled device data.  Last Pain:  Vitals:   02/19/23 1111  TempSrc: Temporal  PainSc: 0-No pain         Complications: No notable events documented.

## 2023-02-19 NOTE — Discharge Instructions (Addendum)
AMBULATORY SURGERY  DISCHARGE INSTRUCTIONS   The drugs that you were given will stay in your system until tomorrow so for the next 24 hours you should not:  Drive an automobile Make any legal decisions Drink any alcoholic beverage   You may resume regular meals tomorrow.  Today it is better to start with liquids and gradually work up to solid foods.  You may eat anything you prefer, but it is better to start with liquids, then soup and crackers, and gradually work up to solid foods.   Please notify your doctor immediately if you have any unusual bleeding, trouble breathing, redness and pain at the surgery site, drainage, fever, or pain not relieved by medication.    Additional Instructions:        Please contact your physician with any problems or Same Day Surgery at [phone], Monday through Friday 6 am to 4 pm, or [place] at [facility] number at [phone].Orthopedic discharge instructions: Keep dressing dry and intact. Keep hand elevated above heart level. May shower after dressing removed on postop day 4 (Sunday). Cover sutures with Band-Aids after drying off, then reapply Velcro splint. Apply ice to affected area frequently. Take ES Tylenol if/when needed.  Return for follow-up in 10-14 days or as scheduled.

## 2023-02-19 NOTE — H&P (Signed)
History of Present Illness: Amy Vazquez is a 77 y.o. who presents today for a history and physical. She is to undergo a open carpal tunnel release of the right wrist on 02/19/2023. Since her last visit here to clinic there have been no improvement in her condition. The patient expresses her desire to proceed with surgery.  The patient does have a history of a right endoscopic carpal tunnel release performed over 15 years ago. The patient states that over the past several years she has been experiencing worsening numbness and tingling involving the first 3 digits of the right hand. The patient does work on a computer and does most of her routine activities with her right hand. She is right-hand dominant. She does report a sharp pain that has begun to wake her up at night. She has been wearing a Velcro wrist splint without significant relief. In the last several years she has undergone several carpal tunnel injections with moderate relief however the burning and tingling continues to return. The patient denies significant pain at today's visit. She denies any trauma or injury affecting the right hand. She denies any personal history of heart attack or stroke. She does have a history of asthma. Denies any history of blood clots, she is not diabetic. The patient has undergone a nerve conduction study in the past which did demonstrate some underlying peripheral neuropathy as well.   Past Medical History: Allergic rhinitis  Anemia in chronic kidney disease 02/08/2021  Asthma (HHS-HCC) 10/30/2013  CKD (chronic kidney disease) stage 3, GFR 30-59 ml/min (CMS/HHS-HCC) 10/30/2013  Colon polyp  Depression  Diverticulosis 06/24/2016  Generalized osteoarthritis of hand 03/04/2019  GERD (gastroesophageal reflux disease) 10/30/2013  History of chicken pox  HTN (hypertension), benign 10/30/2013  Hyperlipidemia 10/30/2013  Hyperplastic colon polyp 06/24/2016  Hypertension  Migraines 10/30/2013  Neuropathy   Vitamin D deficiency   Past Surgical History: COLONOSCOPY 01/21/2011  COLONOSCOPY 06/24/2016 (Hyperplastic colon polyp/PHx colon polyps/Diverticulosis/Repeat 75yrs/MUS)  C3/4 anterior cervical discectomy and fusion 01/24/2020  Dr Lucy Chris at [facility], [employer]  Colonoscopy @ [facility] 06/14/2022 (No polyp tissue recovered on colonoscopy)  APPENDECTOMY  CARPAL TUNNEL RELEASE  CHOLECYSTECTOMY  ENDOSCOPIC CARPAL TUNNEL RELEASE Right  EXTRACTION TEETH (Wisdom tooth)  HYSTERECTOMY  SPINE SURGERY (cervical)  TONSILLECTOMY   Past Family History: Diabetes type II Mother  High blood pressure (Hypertension) Mother  Cancer Mother  Diabetes Mother  Coronary Artery Disease (Blocked arteries around heart) Father  High blood pressure (Hypertension) Father  Heart disease Father  Diabetes type II Sister  Diabetes type II Sister  Obesity Sister  Diabetes Sister  High blood pressure (Hypertension) Sister  Diabetes type II Sister  Diabetes Sister  High blood pressure (Hypertension) Sister  Kidney disease Sister  Obesity Sister  Skin cancer Sister  Colon cancer Neg Hx  Colon polyps Neg Hx  Rectal cancer Neg Hx  Liver disease Neg Hx  Ulcers Neg Hx   Medications: acetaminophen (TYLENOL) 500 mg capsule Take 1,000 mg by mouth as needed for Pain  buPROPion (WELLBUTRIN SR) 150 MG SR tablet TAKE 1 TABLET TWICE DAILY 180 tablet 3  calcitRIOL (ROCALTROL) 0.25 MCG capsule Take 1 capsule by mouth once daily  cholecalciferol (VITAMIN D3) 1000 unit tablet Take 1 tablet by mouth once daily  fluticasone propion-salmeteroL (WIXELA INHUB) 250-50 mcg/dose diskus inhaler Inhale 1 inhalation into the lungs every 12 (twelve) hours 180 each 3  gabapentin (NEURONTIN) 300 MG capsule Take 1 capsule (300 mg total) by mouth 4 (four) times daily 360  capsule 3  lisinopriL-hydroCHLOROthiazide (ZESTORETIC) 20-25 mg tablet TAKE 1 TABLET EVERY DAY 90 tablet 3  pravastatin (PRAVACHOL) 40 MG tablet TAKE 1 TABLET EVERY NIGHT  90 tablet 3  sertraline (ZOLOFT) 100 MG tablet take 1 tablet every day 90 tablet 3  triamcinolone 0.1 % cream Apply topically 2 (two) times daily Do not use longer than 2 consecutive weeks. 80 g 0  verapamiL (VERELAN) 180 MG SR capsule TAKE 1 CAPSULE EVERY NIGHT 90 capsule 3   Allergies: No Known Allergies   Review of Systems: A comprehensive 14 point ROS was performed, reviewed, and the pertinent orthopaedic findings are documented in the HPI.  Physical Exam: BP (!) 140/80 (BP Location: Left upper arm, Patient Position: Sitting, BP Cuff Size: Adult)  Ht 158.8 cm (5' 2.5")  Wt 58.7 kg (129 lb 6.4 oz)  BMI 23.29 kg/m   General: Well-developed well-nourished female seen in no acute distress.   HEENT: Atraumatic,normocephalic. Pupils are equal and reactive to light. Oropharynx is clear with moist mucosa  Lungs: Clear to auscultation bilaterally   Cardiovascular: Regular rate and rhythm. Normal S1, S2. No murmurs. No appreciable gallops or rubs. Peripheral pulses are palpable.  Abdomen: Soft, non-tender, nondistended. Bowel sounds present  Extremity: Normal motion of the hand and digits. No swelling, erythema, or ecchymosis is noted. There is no triggering or locking of the digits noted. No thenar atrophy is visualized. Previous well-healed endoscopic carpal tunnel release incision is well-healed without any evidence for infection. No swelling identified to the right wrist. No intrinsic wasting. The patient has a positive Phalen's test. The patient has a positive Tinel's test. The patient has decrease pinprick and light touch sensation in the median nerve distribution. There is normal grip strength and pincer strength. The patient has less than 2 second capillary refill with good skin warmth. Normal radial and ulnar pulse is palpated.   Neurological: The patient is alert and oriented Sensation to light touch appears to be intact and within normal limits Gross motor strength  appeared to be equal to 5/5  Vascular : Peripheral pulses felt to be palpable. Capillary refill appears to be intact and within normal limits  Impression: 1.  Recurrent carpal tunnel syndrome right wrist  Plan:  1. Treatment options were discussed today with the patient. 2. The patient has a history of a right endoscopic carpal tunnel release performed over 15 years ago and is now experiencing ongoing carpal tunnel symptoms again in the right hand. 3. The patient has a positive Tinel's test and positive carpal tunnel compression test at today's appointment. 4. The patient is quite frustrated by her ongoing right hand numbness and tingling. After a discussion of the risk and benefits of surgical intervention the patient would like to proceed with a revision open carpal tunnel release to be performed by Dr. Joice Lofts. 5. This document will serve as a surgical history and physical for the patient. 6. The patient will follow-up per standard postop protocol. They can call the clinic they have any questions, new symptoms develop or symptoms worsen.  The procedure was discussed with the patient, as were the potential risks (including bleeding, infection, nerve and/or blood vessel injury, persistent or recurrent numbness and tingling, failure of the release, progression of arthritis, need for further surgery, blood clots, strokes, heart attacks and/or arhythmias, pneumonia, etc.) and benefits. The patient states her understanding and wishes to proceed.    H&P reviewed and patient re-examined. No changes.

## 2023-02-20 ENCOUNTER — Encounter: Payer: Self-pay | Admitting: Surgery

## 2023-02-27 NOTE — Progress Notes (Signed)
Based on her most recent GFR, she should be considered to have stage IV kidney disease.  Thank you!

## 2023-02-28 DIAGNOSIS — R768 Other specified abnormal immunological findings in serum: Secondary | ICD-10-CM | POA: Diagnosis not present

## 2023-02-28 DIAGNOSIS — M159 Polyosteoarthritis, unspecified: Secondary | ICD-10-CM | POA: Diagnosis not present

## 2023-06-25 DIAGNOSIS — I10 Essential (primary) hypertension: Secondary | ICD-10-CM | POA: Diagnosis not present

## 2023-06-25 DIAGNOSIS — N184 Chronic kidney disease, stage 4 (severe): Secondary | ICD-10-CM | POA: Diagnosis not present

## 2023-07-02 DIAGNOSIS — Z87891 Personal history of nicotine dependence: Secondary | ICD-10-CM | POA: Diagnosis not present

## 2023-07-02 DIAGNOSIS — J4520 Mild intermittent asthma, uncomplicated: Secondary | ICD-10-CM | POA: Diagnosis not present

## 2023-07-02 DIAGNOSIS — I700 Atherosclerosis of aorta: Secondary | ICD-10-CM | POA: Diagnosis not present

## 2023-07-02 DIAGNOSIS — E538 Deficiency of other specified B group vitamins: Secondary | ICD-10-CM | POA: Diagnosis not present

## 2023-07-02 DIAGNOSIS — N184 Chronic kidney disease, stage 4 (severe): Secondary | ICD-10-CM | POA: Diagnosis not present

## 2023-07-02 DIAGNOSIS — I129 Hypertensive chronic kidney disease with stage 1 through stage 4 chronic kidney disease, or unspecified chronic kidney disease: Secondary | ICD-10-CM | POA: Diagnosis not present

## 2023-07-02 DIAGNOSIS — F325 Major depressive disorder, single episode, in full remission: Secondary | ICD-10-CM | POA: Diagnosis not present

## 2023-07-02 DIAGNOSIS — E7800 Pure hypercholesterolemia, unspecified: Secondary | ICD-10-CM | POA: Diagnosis not present

## 2023-08-16 ENCOUNTER — Encounter: Payer: Self-pay | Admitting: Emergency Medicine

## 2023-08-16 ENCOUNTER — Emergency Department
Admission: EM | Admit: 2023-08-16 | Discharge: 2023-08-16 | Disposition: A | Discharge status: Home / Self Care | Attending: Emergency Medicine | Admitting: Emergency Medicine

## 2023-08-16 ENCOUNTER — Emergency Department

## 2023-08-16 ENCOUNTER — Other Ambulatory Visit: Payer: Self-pay

## 2023-08-16 DIAGNOSIS — J45909 Unspecified asthma, uncomplicated: Secondary | ICD-10-CM | POA: Diagnosis not present

## 2023-08-16 DIAGNOSIS — J101 Influenza due to other identified influenza virus with other respiratory manifestations: Secondary | ICD-10-CM | POA: Insufficient documentation

## 2023-08-16 DIAGNOSIS — R0602 Shortness of breath: Secondary | ICD-10-CM | POA: Diagnosis not present

## 2023-08-16 DIAGNOSIS — J111 Influenza due to unidentified influenza virus with other respiratory manifestations: Secondary | ICD-10-CM | POA: Insufficient documentation

## 2023-08-16 DIAGNOSIS — R7989 Other specified abnormal findings of blood chemistry: Secondary | ICD-10-CM | POA: Insufficient documentation

## 2023-08-16 DIAGNOSIS — I10 Essential (primary) hypertension: Secondary | ICD-10-CM | POA: Diagnosis not present

## 2023-08-16 LAB — COMPREHENSIVE METABOLIC PANEL WITH GFR
ALT: 74 U/L — ABNORMAL HIGH (ref 0–44)
AST: 73 U/L — ABNORMAL HIGH (ref 15–41)
Albumin: 4.1 g/dL (ref 3.5–5.0)
Alkaline Phosphatase: 123 U/L (ref 38–126)
Anion gap: 12 (ref 5–15)
BUN: 41 mg/dL — ABNORMAL HIGH (ref 8–23)
CO2: 22 mmol/L (ref 22–32)
Calcium: 10.2 mg/dL (ref 8.9–10.3)
Chloride: 104 mmol/L (ref 98–111)
Creatinine, Ser: 2.34 mg/dL — ABNORMAL HIGH (ref 0.44–1.00)
GFR, Estimated: 21 mL/min — ABNORMAL LOW (ref >60)
Glucose, Bld: 104 mg/dL — ABNORMAL HIGH (ref 70–99)
Potassium: 4.2 mmol/L (ref 3.5–5.1)
Sodium: 138 mmol/L (ref 135–145)
Total Bilirubin: 0.9 mg/dL (ref 0.0–1.2)
Total Protein: 7.7 g/dL (ref 6.5–8.1)

## 2023-08-16 LAB — CBC WITH DIFFERENTIAL/PLATELET
Abs Immature Granulocytes: 0.03 10*3/uL (ref 0.00–0.07)
Abs Immature Granulocytes: 0.03 10*3/uL (ref 0.00–0.07)
Basophils Absolute: 0 10*3/uL (ref 0.0–0.1)
Basophils Absolute: 0 10*3/uL (ref 0.0–0.1)
Basophils Relative: 0 %
Basophils Relative: 0 %
Eosinophils Absolute: 0 10*3/uL (ref 0.0–0.5)
Eosinophils Absolute: 0 10*3/uL (ref 0.0–0.5)
Eosinophils Relative: 0 %
Eosinophils Relative: 0 %
HCT: 38.2 % (ref 36.0–46.0)
HCT: 38.9 % (ref 36.0–46.0)
Hemoglobin: 13.2 g/dL (ref 12.0–15.0)
Hemoglobin: 13.3 g/dL (ref 12.0–15.0)
Immature Granulocytes: 0 %
Immature Granulocytes: 1 %
Lymphocytes Relative: 12 %
Lymphocytes Relative: 5 %
Lymphs Abs: 0.8 10*3/uL (ref 0.7–4.0)
Lymphs Abs: 0.3 10*3/uL — ABNORMAL LOW (ref 0.7–4.0)
MCH: 30.4 pg (ref 26.0–34.0)
MCH: 30.4 pg (ref 26.0–34.0)
MCHC: 34.6 g/dL (ref 30.0–36.0)
MCHC: 34.2 g/dL (ref 30.0–36.0)
MCV: 88 fL (ref 80.0–100.0)
MCV: 89 fL (ref 80.0–100.0)
Monocytes Absolute: 0.6 10*3/uL (ref 0.1–1.0)
Monocytes Absolute: 0.1 10*3/uL (ref 0.1–1.0)
Monocytes Relative: 9 %
Monocytes Relative: 2 %
Neutro Abs: 5.2 10*3/uL (ref 1.7–7.7)
Neutro Abs: 6.1 10*3/uL (ref 1.7–7.7)
Neutrophils Relative %: 79 %
Neutrophils Relative %: 92 %
Platelets: 230 10*3/uL (ref 150–400)
Platelets: 210 10*3/uL (ref 150–400)
RBC: 4.34 MIL/uL (ref 3.87–5.11)
RBC: 4.37 MIL/uL (ref 3.87–5.11)
RDW: 12 % (ref 11.5–15.5)
RDW: 12 % (ref 11.5–15.5)
WBC: 6.7 10*3/uL (ref 4.0–10.5)
WBC: 6.6 10*3/uL (ref 4.0–10.5)
nRBC: 0 % (ref 0.0–0.2)
nRBC: 0 % (ref 0.0–0.2)

## 2023-08-16 LAB — BASIC METABOLIC PANEL WITH GFR
Anion gap: 13 (ref 5–15)
BUN: 49 mg/dL — ABNORMAL HIGH (ref 8–23)
CO2: 22 mmol/L (ref 22–32)
Calcium: 10.6 mg/dL — ABNORMAL HIGH (ref 8.9–10.3)
Chloride: 101 mmol/L (ref 98–111)
Creatinine, Ser: 2.16 mg/dL — ABNORMAL HIGH (ref 0.44–1.00)
GFR, Estimated: 23 mL/min — ABNORMAL LOW (ref >60)
Glucose, Bld: 137 mg/dL — ABNORMAL HIGH (ref 70–99)
Potassium: 4.4 mmol/L (ref 3.5–5.1)
Sodium: 136 mmol/L (ref 135–145)

## 2023-08-16 LAB — RESP PANEL BY RT-PCR (RSV, FLU A&B, COVID)  RVPGX2
Influenza A by PCR: POSITIVE — AB
Influenza B by PCR: NEGATIVE
Resp Syncytial Virus by PCR: NEGATIVE
SARS Coronavirus 2 by RT PCR: NEGATIVE

## 2023-08-16 LAB — D-DIMER, QUANTITATIVE: D-Dimer, Quant: 0.67 ug{FEU}/mL — ABNORMAL HIGH (ref 0.00–0.50)

## 2023-08-16 LAB — TROPONIN I (HIGH SENSITIVITY): Troponin I (High Sensitivity): 13 ng/L (ref <18)

## 2023-08-16 MED ORDER — SODIUM CHLORIDE 0.9 % IV BOLUS
1000.0000 mL | Freq: Once | INTRAVENOUS | Status: AC
  Administered 2023-08-16: 1000 mL via INTRAVENOUS

## 2023-08-16 MED ORDER — PREDNISONE 20 MG PO TABS: 40.0000 mg | ORAL_TABLET | Freq: Every day | ORAL | 0 refills | Status: AC

## 2023-08-16 MED ORDER — IPRATROPIUM-ALBUTEROL 0.5-2.5 (3) MG/3ML IN SOLN
3.0000 mL | Freq: Once | RESPIRATORY_TRACT | Status: AC
  Administered 2023-08-16: 3 mL via RESPIRATORY_TRACT
  Filled 2023-08-16: qty 3

## 2023-08-16 MED ORDER — ALBUTEROL SULFATE HFA 108 (90 BASE) MCG/ACT IN AERS
2.0000 | INHALATION_SPRAY | Freq: Four times a day (QID) | RESPIRATORY_TRACT | 2 refills | Status: AC | PRN
Start: 2023-08-16 — End: ?

## 2023-08-16 MED ORDER — OSELTAMIVIR PHOSPHATE 30 MG PO CAPS: 30.0000 mg | ORAL_CAPSULE | Freq: Every day | ORAL | 0 refills | Status: AC

## 2023-08-16 NOTE — ED Triage Notes (Signed)
 POV with CC of SOB since yesterday evening. Pt tripoding. A&Ox4.

## 2023-08-16 NOTE — ED Provider Notes (Signed)
 [facility] Provider Note    Event Date/Time   First MD Initiated Contact with Patient 08/16/23 2129     (approximate)   History   Influenza   HPI  Amy Vazquez is a 78 y.o. female who returns to the emergency department today because of concerns for continued shortness of breath and weakness.  The patient was seen in the emergency department earlier this morning and diagnosed with influenza.  She went home and states that her shortness of breath has continued.  Additionally she has felt weak and has had decreased appetite and oral intake.  She has tried her albuterol inhaler with no significant relief today. Per chart review had CXR performed which did not have any concerning abnormality earlier today.     Physical Exam   Triage Vital Signs: ED Triage Vitals  Encounter Vitals Group     BP 08/16/23 1956 (!) 154/72     Systolic BP Percentile --      Diastolic BP Percentile --      Pulse Rate 08/16/23 1956 91     Resp 08/16/23 1956 20     Temp 08/16/23 1956 98.7 F (37.1 C)     Temp Source 08/16/23 1956 Oral     SpO2 08/16/23 1956 93 %     Weight 08/16/23 1952 133 lb 1.8 oz (60.4 kg)     Height 08/16/23 1952 5' 2.5" (1.588 m)     Head Circumference --      Peak Flow --      Pain Score 08/16/23 1952 0     Pain Loc --      Pain Education --      Exclude from Growth Chart --     Most recent vital signs: Vitals:   08/16/23 2158 08/16/23 2230  BP:  (!) 167/75  Pulse: 86 85  Resp:  20  Temp:    SpO2: 93% 100%   General: Awake, alert, oriented. CV:  Good peripheral perfusion. Regular rate and rhythm. Resp:  Normal effort. Lungs clear. Abd:  No distention.    ED Results / Procedures / Treatments   Labs (all labs ordered are listed, but only abnormal results are displayed) Labs Reviewed  CBC WITH DIFFERENTIAL/PLATELET - Abnormal; Notable for the following components:      Result Value   Lymphs Abs 0.3 (*)    All other components  within normal limits  BASIC METABOLIC PANEL WITH GFR - Abnormal; Notable for the following components:   Glucose, Bld 137 (*)    BUN 49 (*)    Creatinine, Ser 2.16 (*)    Calcium 10.6 (*)    GFR, Estimated 23 (*)    All other components within normal limits     EKG  None   RADIOLOGY None  PROCEDURES:  Critical Care performed: No   MEDICATIONS ORDERED IN ED: Medications - No data to display   IMPRESSION / MDM / ASSESSMENT AND PLAN / ED COURSE  I reviewed the triage vital signs and the nursing notes.                              Differential diagnosis includes, but is not limited to, influenza, dehydration, electrolyte abnormality  Patient's presentation is most consistent with acute presentation with potential threat to life or bodily function.   Patient presented to the emergency department today because of concerns for continued shortness of breath and  weakness in the setting of recent influenza diagnosis.  On exam patient is neither febrile nor tachycardic.  Patient is not hypoxic.  Lungs are clear.  I did discuss with the patient expected course of influenza.  Will give IV fluids and DuoNeb treatments here to see if we can get patient to feel some improvement however did discuss with patient likely she will not feel well for a number of days. Will check blood work for any significant changes from earlier today.  Blood work without any significant changes.  Patient did feel better after IV fluids and DuoNeb treatments.  At this time I think it is reasonable for patient to be discharged.    FINAL CLINICAL IMPRESSION(S) / ED DIAGNOSES   Final diagnoses:  Influenza     Note:  This document was prepared using Dragon voice recognition software and may include unintentional dictation errors.    Marylynn Soho, MD 08/16/23 [phone]

## 2023-08-16 NOTE — ED Triage Notes (Signed)
 Pt presents to the ED via POV with complaints of Flu+. Pt states she was seen here last night and was diagnosed with the flu and her breathing hasn't changed. Pt with NAD. Respirations equal and unlabored. A&Ox4 at this time. Denies CP or SOB.

## 2023-08-16 NOTE — ED Provider Notes (Signed)
 [facility] Provider Note    Event Date/Time   First MD Initiated Contact with Patient 08/16/23 0411     (approximate)   History   Shortness of Breath   HPI  Amy Vazquez is a 78 year old female with history of asthma, HTN presenting to the emergency department for evaluation of shortness of breath.  Yesterday, patient had onset of shortness of breath with associated cough.  Noted to fevers, chest pain.  No known sick contacts.  Denies history of similar.  Denies using her rescue inhaler, unsure if this feels similar to an asthma flare.      Physical Exam   Triage Vital Signs: ED Triage Vitals  Encounter Vitals Group     BP 08/16/23 0415 (!) 161/99     Systolic BP Percentile --      Diastolic BP Percentile --      Pulse Rate 08/16/23 0415 91     Resp 08/16/23 0415 (!) 28     Temp 08/16/23 0416 100.2 F (37.9 C)     Temp Source 08/16/23 0416 Oral     SpO2 08/16/23 0415 92 %     Weight 08/16/23 0414 133 lb (60.3 kg)     Height 08/16/23 0414 5' 2.5" (1.588 m)     Head Circumference --      Peak Flow --      Pain Score 08/16/23 0414 0     Pain Loc --      Pain Education --      Exclude from Growth Chart --     Most recent vital signs: Vitals:   08/16/23 0545 08/16/23 0600  BP:  (!) 157/67  Pulse: 90 89  Resp: (!) 24 (!) 24  Temp:    SpO2: 95% 93%     General: Awake, interactive  CV:  Regular rate, good peripheral perfusion.  Resp:  Tachypneic with mildly labored respirations, lung sounds somewhat diminished but no appreciable wheezing Abd:  Nondistended.  Neuro:  Symmetric facial movement, fluid speech   ED Results / Procedures / Treatments   Labs (all labs ordered are listed, but only abnormal results are displayed) Labs Reviewed  RESP PANEL BY RT-PCR (RSV, FLU A&B, COVID)  RVPGX2 - Abnormal; Notable for the following components:      Result Value   Influenza A by PCR POSITIVE (*)    All other components within normal  limits  COMPREHENSIVE METABOLIC PANEL WITH GFR - Abnormal; Notable for the following components:   Glucose, Bld 104 (*)    BUN 41 (*)    Creatinine, Ser 2.34 (*)    AST 73 (*)    ALT 74 (*)    GFR, Estimated 21 (*)    All other components within normal limits  D-DIMER, QUANTITATIVE - Abnormal; Notable for the following components:   D-Dimer, Quant 0.67 (*)    All other components within normal limits  CBC WITH DIFFERENTIAL/PLATELET  TROPONIN I (HIGH SENSITIVITY)     EKG EKG independently reviewed interpreted by myself (ER attending) demonstrates:  EKG demonstrates sinus rhythm rate of 90, PR 153, QRS 112, QTc 458, no acute ST changes  RADIOLOGY Imaging independently reviewed and interpreted by myself demonstrates:  CXR without focal consolidation  DG Chest Port 1 View Result Date: 08/16/2023 CLINICAL DATA:  Shortness of breath EXAM: PORTABLE CHEST 1 VIEW COMPARISON:  07/15/2008 FINDINGS: Normal heart size and mediastinal contours. No acute infiltrate or edema. No effusion or pneumothorax. No acute osseous  findings. IMPRESSION: No active disease. Electronically Signed   By: Ronnette Coke M.D.   On: 08/16/2023 05:19    PROCEDURES:  Critical Care performed: No  Procedures   MEDICATIONS ORDERED IN ED: Medications  ipratropium-albuterol (DUONEB) 0.5-2.5 (3) MG/3ML nebulizer solution 3 mL (3 mLs Nebulization Given 08/16/23 0447)     IMPRESSION / MDM / ASSESSMENT AND PLAN / ED COURSE  I reviewed the triage vital signs and the nursing notes.  Differential diagnosis includes, but is not limited to, pneumonia, pneumothorax, ACS, PE, asthma flare  Patient's presentation is most consistent with acute presentation with potential threat to life or bodily function.  78 year old female presenting with shortness of breath.  Will obtain his labs, x-Rada Zegers, EKG to further evaluate.  Not significantly wheezing on exam but will trial DuoNeb to see if this helps with her symptoms.  6:24  AM Labs overall reassuring.  Creatinine stable to slightly worsened from prior.  D-dimer slightly elevated at 0.67, but under age-adjusted cut off for patient, very low suspicion PE.  Flu a test did return positive, suspect this is etiology of patient's symptoms.  May have caused a mild asthma flare as well.  On reevaluation, patient reports feeling much improved after getting a breathing treatment.  She is will discharge home.  Does not have a rescue inhaler at home, will DC with a prescription for this.  Will also send him a short steroid course as I suspect her flu may trigger a asthma flare.  Will also DC with Tamiflu after shared decision making.  Strict return precautions provided.  Patient discharged in stable condition.     FINAL CLINICAL IMPRESSION(S) / ED DIAGNOSES   Final diagnoses:  Influenza A     Rx / DC Orders   ED Discharge Orders          Ordered    oseltamivir (TAMIFLU) 30 MG capsule  Daily        08/16/23 0624    albuterol (VENTOLIN HFA) 108 (90 Base) MCG/ACT inhaler  Every 6 hours PRN        08/16/23 0624    predniSONE (DELTASONE) 20 MG tablet  Daily with breakfast        08/16/23 1610             Note:  This document was prepared using Dragon voice recognition software and may include unintentional dictation errors.   Claria Crofts, MD 08/16/23 [phone]

## 2023-08-16 NOTE — Discharge Instructions (Signed)
 You were seen in the ER for shortness of breath.  Your influenza testing returned positive which I suspect is the cause of your symptoms.  This may be causing a flare of your asthma.  I sent a prescription for Tamiflu, an albuterol inhaler, and a short course of steroids to your pharmacy.  Follow with your primary care doctor for further evaluation.  Return to the ER for new or worsening symptoms.

## 2023-10-28 ENCOUNTER — Other Ambulatory Visit (HOSPITAL_COMMUNITY): Payer: Self-pay

## 2023-11-13 DIAGNOSIS — R8290 Unspecified abnormal findings in urine: Secondary | ICD-10-CM | POA: Diagnosis not present

## 2023-11-13 DIAGNOSIS — N1832 Chronic kidney disease, stage 3b: Secondary | ICD-10-CM | POA: Diagnosis not present

## 2023-11-13 DIAGNOSIS — I10 Essential (primary) hypertension: Secondary | ICD-10-CM | POA: Diagnosis not present

## 2023-11-13 DIAGNOSIS — R609 Edema, unspecified: Secondary | ICD-10-CM | POA: Diagnosis not present

## 2023-11-13 DIAGNOSIS — R809 Proteinuria, unspecified: Secondary | ICD-10-CM | POA: Diagnosis not present

## 2023-11-13 DIAGNOSIS — N184 Chronic kidney disease, stage 4 (severe): Secondary | ICD-10-CM | POA: Diagnosis not present

## 2023-11-13 DIAGNOSIS — Q600 Renal agenesis, unilateral: Secondary | ICD-10-CM | POA: Diagnosis not present

## 2023-11-13 DIAGNOSIS — N2581 Secondary hyperparathyroidism of renal origin: Secondary | ICD-10-CM | POA: Diagnosis not present

## 2023-11-19 DIAGNOSIS — R609 Edema, unspecified: Secondary | ICD-10-CM | POA: Diagnosis not present

## 2023-11-19 DIAGNOSIS — N2581 Secondary hyperparathyroidism of renal origin: Secondary | ICD-10-CM | POA: Diagnosis not present

## 2023-11-19 DIAGNOSIS — R809 Proteinuria, unspecified: Secondary | ICD-10-CM | POA: Diagnosis not present

## 2023-11-19 DIAGNOSIS — D631 Anemia in chronic kidney disease: Secondary | ICD-10-CM | POA: Diagnosis not present

## 2023-11-19 DIAGNOSIS — I10 Essential (primary) hypertension: Secondary | ICD-10-CM | POA: Diagnosis not present

## 2023-11-19 DIAGNOSIS — N184 Chronic kidney disease, stage 4 (severe): Secondary | ICD-10-CM | POA: Diagnosis not present

## 2023-11-19 DIAGNOSIS — Q600 Renal agenesis, unilateral: Secondary | ICD-10-CM | POA: Diagnosis not present

## 2023-11-21 ENCOUNTER — Other Ambulatory Visit: Payer: Self-pay | Admitting: Nephrology

## 2023-11-21 DIAGNOSIS — D631 Anemia in chronic kidney disease: Secondary | ICD-10-CM

## 2023-11-21 DIAGNOSIS — R809 Proteinuria, unspecified: Secondary | ICD-10-CM

## 2023-11-21 DIAGNOSIS — N2581 Secondary hyperparathyroidism of renal origin: Secondary | ICD-10-CM

## 2023-11-21 DIAGNOSIS — R609 Edema, unspecified: Secondary | ICD-10-CM

## 2023-11-21 DIAGNOSIS — Q602 Renal agenesis, unspecified: Secondary | ICD-10-CM

## 2023-11-21 DIAGNOSIS — N184 Chronic kidney disease, stage 4 (severe): Secondary | ICD-10-CM

## 2023-11-21 DIAGNOSIS — I10 Essential (primary) hypertension: Secondary | ICD-10-CM

## 2023-11-28 ENCOUNTER — Ambulatory Visit
Admission: RE | Admit: 2023-11-28 | Discharge: 2023-11-28 | Disposition: A | Discharge status: Home / Self Care | Source: Ambulatory Visit | Attending: Nephrology | Admitting: Nephrology

## 2023-11-28 DIAGNOSIS — R609 Edema, unspecified: Secondary | ICD-10-CM | POA: Insufficient documentation

## 2023-11-28 DIAGNOSIS — D631 Anemia in chronic kidney disease: Secondary | ICD-10-CM | POA: Insufficient documentation

## 2023-11-28 DIAGNOSIS — I129 Hypertensive chronic kidney disease with stage 1 through stage 4 chronic kidney disease, or unspecified chronic kidney disease: Secondary | ICD-10-CM | POA: Insufficient documentation

## 2023-11-28 DIAGNOSIS — Q602 Renal agenesis, unspecified: Secondary | ICD-10-CM | POA: Diagnosis not present

## 2023-11-28 DIAGNOSIS — R809 Proteinuria, unspecified: Secondary | ICD-10-CM | POA: Diagnosis not present

## 2023-11-28 DIAGNOSIS — N184 Chronic kidney disease, stage 4 (severe): Secondary | ICD-10-CM | POA: Diagnosis not present

## 2023-11-28 DIAGNOSIS — I10 Essential (primary) hypertension: Secondary | ICD-10-CM | POA: Insufficient documentation

## 2023-11-28 DIAGNOSIS — N2581 Secondary hyperparathyroidism of renal origin: Secondary | ICD-10-CM | POA: Insufficient documentation

## 2023-12-25 DIAGNOSIS — E538 Deficiency of other specified B group vitamins: Secondary | ICD-10-CM | POA: Diagnosis not present

## 2023-12-25 DIAGNOSIS — H2512 Age-related nuclear cataract, left eye: Secondary | ICD-10-CM | POA: Diagnosis not present

## 2023-12-25 DIAGNOSIS — N184 Chronic kidney disease, stage 4 (severe): Secondary | ICD-10-CM | POA: Diagnosis not present

## 2023-12-25 DIAGNOSIS — D3131 Benign neoplasm of right choroid: Secondary | ICD-10-CM | POA: Diagnosis not present

## 2023-12-25 DIAGNOSIS — E7800 Pure hypercholesterolemia, unspecified: Secondary | ICD-10-CM | POA: Diagnosis not present

## 2023-12-25 DIAGNOSIS — Z0100 Encounter for examination of eyes and vision without abnormal findings: Secondary | ICD-10-CM | POA: Diagnosis not present

## 2023-12-25 DIAGNOSIS — Z961 Presence of intraocular lens: Secondary | ICD-10-CM | POA: Diagnosis not present

## 2023-12-25 DIAGNOSIS — I10 Essential (primary) hypertension: Secondary | ICD-10-CM | POA: Diagnosis not present

## 2023-12-25 DIAGNOSIS — H35341 Macular cyst, hole, or pseudohole, right eye: Secondary | ICD-10-CM | POA: Diagnosis not present

## 2024-01-01 DIAGNOSIS — N184 Chronic kidney disease, stage 4 (severe): Secondary | ICD-10-CM | POA: Diagnosis not present

## 2024-01-01 DIAGNOSIS — Z0000 Encounter for general adult medical examination without abnormal findings: Secondary | ICD-10-CM | POA: Diagnosis not present

## 2024-01-01 DIAGNOSIS — J4520 Mild intermittent asthma, uncomplicated: Secondary | ICD-10-CM | POA: Diagnosis not present

## 2024-01-01 DIAGNOSIS — I129 Hypertensive chronic kidney disease with stage 1 through stage 4 chronic kidney disease, or unspecified chronic kidney disease: Secondary | ICD-10-CM | POA: Diagnosis not present

## 2024-01-01 DIAGNOSIS — F325 Major depressive disorder, single episode, in full remission: Secondary | ICD-10-CM | POA: Diagnosis not present

## 2024-01-01 DIAGNOSIS — Z1331 Encounter for screening for depression: Secondary | ICD-10-CM | POA: Diagnosis not present

## 2024-01-01 DIAGNOSIS — E538 Deficiency of other specified B group vitamins: Secondary | ICD-10-CM | POA: Diagnosis not present

## 2024-01-01 DIAGNOSIS — E7800 Pure hypercholesterolemia, unspecified: Secondary | ICD-10-CM | POA: Diagnosis not present

## 2024-01-01 DIAGNOSIS — R799 Abnormal finding of blood chemistry, unspecified: Secondary | ICD-10-CM | POA: Diagnosis not present

## 2024-01-19 DIAGNOSIS — D631 Anemia in chronic kidney disease: Secondary | ICD-10-CM | POA: Diagnosis not present

## 2024-01-19 DIAGNOSIS — R809 Proteinuria, unspecified: Secondary | ICD-10-CM | POA: Diagnosis not present

## 2024-01-19 DIAGNOSIS — N2581 Secondary hyperparathyroidism of renal origin: Secondary | ICD-10-CM | POA: Diagnosis not present

## 2024-01-19 DIAGNOSIS — Q600 Renal agenesis, unilateral: Secondary | ICD-10-CM | POA: Diagnosis not present

## 2024-01-19 DIAGNOSIS — I10 Essential (primary) hypertension: Secondary | ICD-10-CM | POA: Diagnosis not present

## 2024-01-19 DIAGNOSIS — N184 Chronic kidney disease, stage 4 (severe): Secondary | ICD-10-CM | POA: Diagnosis not present

## 2024-01-19 DIAGNOSIS — R609 Edema, unspecified: Secondary | ICD-10-CM | POA: Diagnosis not present

## 2024-01-21 DIAGNOSIS — I10 Essential (primary) hypertension: Secondary | ICD-10-CM | POA: Diagnosis not present

## 2024-01-21 DIAGNOSIS — R609 Edema, unspecified: Secondary | ICD-10-CM | POA: Diagnosis not present

## 2024-01-21 DIAGNOSIS — N2581 Secondary hyperparathyroidism of renal origin: Secondary | ICD-10-CM | POA: Diagnosis not present

## 2024-01-21 DIAGNOSIS — Q600 Renal agenesis, unilateral: Secondary | ICD-10-CM | POA: Diagnosis not present

## 2024-01-21 DIAGNOSIS — D631 Anemia in chronic kidney disease: Secondary | ICD-10-CM | POA: Diagnosis not present

## 2024-01-21 DIAGNOSIS — R809 Proteinuria, unspecified: Secondary | ICD-10-CM | POA: Diagnosis not present

## 2024-01-21 DIAGNOSIS — N184 Chronic kidney disease, stage 4 (severe): Secondary | ICD-10-CM | POA: Diagnosis not present

## 2024-03-05 DIAGNOSIS — R7689 Other specified abnormal immunological findings in serum: Secondary | ICD-10-CM | POA: Diagnosis not present

## 2024-03-05 DIAGNOSIS — M159 Polyosteoarthritis, unspecified: Secondary | ICD-10-CM | POA: Diagnosis not present

## 2024-03-17 DIAGNOSIS — R609 Edema, unspecified: Secondary | ICD-10-CM | POA: Diagnosis not present

## 2024-03-17 DIAGNOSIS — N184 Chronic kidney disease, stage 4 (severe): Secondary | ICD-10-CM | POA: Diagnosis not present

## 2024-03-17 DIAGNOSIS — Q600 Renal agenesis, unilateral: Secondary | ICD-10-CM | POA: Diagnosis not present

## 2024-03-17 DIAGNOSIS — D631 Anemia in chronic kidney disease: Secondary | ICD-10-CM | POA: Diagnosis not present

## 2024-03-17 DIAGNOSIS — N2581 Secondary hyperparathyroidism of renal origin: Secondary | ICD-10-CM | POA: Diagnosis not present

## 2024-03-17 DIAGNOSIS — I10 Essential (primary) hypertension: Secondary | ICD-10-CM | POA: Diagnosis not present

## 2024-03-17 DIAGNOSIS — R809 Proteinuria, unspecified: Secondary | ICD-10-CM | POA: Diagnosis not present

## 2024-03-25 DIAGNOSIS — F17200 Nicotine dependence, unspecified, uncomplicated: Secondary | ICD-10-CM | POA: Diagnosis not present

## 2024-03-25 DIAGNOSIS — D631 Anemia in chronic kidney disease: Secondary | ICD-10-CM | POA: Diagnosis not present

## 2024-03-25 DIAGNOSIS — N2581 Secondary hyperparathyroidism of renal origin: Secondary | ICD-10-CM | POA: Diagnosis not present

## 2024-03-25 DIAGNOSIS — I129 Hypertensive chronic kidney disease with stage 1 through stage 4 chronic kidney disease, or unspecified chronic kidney disease: Secondary | ICD-10-CM | POA: Diagnosis not present
